# Patient Record
Sex: Female | Born: 1950 | Race: White | Hispanic: No | Smoking: Current every day smoker
Health system: Southern US, Community
[De-identification: ages and names within clinical notes are randomized; demographics above are authoritative.]

## PROBLEM LIST (undated history)

## (undated) DIAGNOSIS — C642 Malignant neoplasm of left kidney, except renal pelvis: Secondary | ICD-10-CM

## (undated) DIAGNOSIS — Z1231 Encounter for screening mammogram for malignant neoplasm of breast: Secondary | ICD-10-CM

## (undated) DIAGNOSIS — R928 Other abnormal and inconclusive findings on diagnostic imaging of breast: Secondary | ICD-10-CM

## (undated) DIAGNOSIS — M25522 Pain in left elbow: Secondary | ICD-10-CM

## (undated) DIAGNOSIS — M25532 Pain in left wrist: Secondary | ICD-10-CM

## (undated) DIAGNOSIS — N189 Chronic kidney disease, unspecified: Secondary | ICD-10-CM

## (undated) DIAGNOSIS — M1990 Unspecified osteoarthritis, unspecified site: Secondary | ICD-10-CM

## (undated) DIAGNOSIS — F419 Anxiety disorder, unspecified: Secondary | ICD-10-CM

## (undated) DIAGNOSIS — C4491 Basal cell carcinoma of skin, unspecified: Secondary | ICD-10-CM

## (undated) DIAGNOSIS — G9050 Complex regional pain syndrome I, unspecified: Secondary | ICD-10-CM

## (undated) HISTORY — PX: PARTIAL NEPHRECTOMY: SHX414

## (undated) HISTORY — PX: EYE SURGERY: SHX253

## (undated) HISTORY — PX: OTHER SURGICAL HISTORY: SHX169

## (undated) HISTORY — PX: KNEE SURGERY: SHX244

## (undated) HISTORY — PX: DEBRIDEMENT TENNIS ELBOW: SHX1442

---
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
## 1991-01-05 DIAGNOSIS — C4491 Basal cell carcinoma of skin, unspecified: Secondary | ICD-10-CM

## 1991-01-05 HISTORY — DX: Basal cell carcinoma of skin, unspecified: C44.91

## 1997-06-09 ENCOUNTER — Ambulatory Visit (HOSPITAL_BASED_OUTPATIENT_CLINIC_OR_DEPARTMENT_OTHER): Admission: RE | Admit: 1997-06-09 | Discharge: 1997-06-09 | Payer: Self-pay | Admitting: Orthopedic Surgery

## 1997-06-16 ENCOUNTER — Other Ambulatory Visit: Admission: RE | Admit: 1997-06-16 | Discharge: 1997-06-16 | Payer: Self-pay | Admitting: Orthopedic Surgery

## 1997-06-16 ENCOUNTER — Inpatient Hospital Stay (HOSPITAL_COMMUNITY): Admission: RE | Admit: 1997-06-16 | Discharge: 1997-06-23 | Payer: Self-pay | Admitting: Orthopedic Surgery

## 1997-06-28 ENCOUNTER — Other Ambulatory Visit: Admission: RE | Admit: 1997-06-28 | Discharge: 1997-06-28 | Payer: Self-pay | Admitting: Orthopedic Surgery

## 1997-07-04 ENCOUNTER — Ambulatory Visit (HOSPITAL_COMMUNITY): Admission: RE | Admit: 1997-07-04 | Discharge: 1997-07-04 | Payer: Self-pay | Admitting: Orthopedic Surgery

## 1997-07-05 ENCOUNTER — Other Ambulatory Visit: Admission: RE | Admit: 1997-07-05 | Discharge: 1997-07-05 | Payer: Self-pay | Admitting: Orthopedic Surgery

## 1999-03-09 ENCOUNTER — Inpatient Hospital Stay (HOSPITAL_COMMUNITY): Admission: RE | Admit: 1999-03-09 | Discharge: 1999-03-13 | Payer: Self-pay | Admitting: Orthopedic Surgery

## 1999-03-13 ENCOUNTER — Encounter: Payer: Self-pay | Admitting: Infectious Diseases

## 2002-07-01 ENCOUNTER — Ambulatory Visit (HOSPITAL_COMMUNITY): Admission: RE | Admit: 2002-07-01 | Discharge: 2002-07-01 | Payer: Self-pay | Admitting: Gastroenterology

## 2002-12-01 ENCOUNTER — Other Ambulatory Visit: Admission: RE | Admit: 2002-12-01 | Discharge: 2002-12-01 | Payer: Self-pay | Admitting: Obstetrics and Gynecology

## 2003-01-19 ENCOUNTER — Inpatient Hospital Stay (HOSPITAL_COMMUNITY): Admission: RE | Admit: 2003-01-19 | Discharge: 2003-01-24 | Payer: Self-pay | Admitting: Orthopedic Surgery

## 2003-02-04 ENCOUNTER — Emergency Department (HOSPITAL_COMMUNITY): Admission: EM | Admit: 2003-02-04 | Discharge: 2003-02-04 | Payer: Self-pay | Admitting: Emergency Medicine

## 2004-05-21 ENCOUNTER — Other Ambulatory Visit: Admission: RE | Admit: 2004-05-21 | Discharge: 2004-05-21 | Payer: Self-pay | Admitting: Obstetrics and Gynecology

## 2004-08-07 ENCOUNTER — Encounter: Admission: RE | Admit: 2004-08-07 | Discharge: 2004-08-07 | Payer: Self-pay | Admitting: Orthopedic Surgery

## 2005-01-29 ENCOUNTER — Encounter: Admission: RE | Admit: 2005-01-29 | Discharge: 2005-01-29 | Payer: Self-pay | Admitting: Family Medicine

## 2005-03-04 ENCOUNTER — Encounter: Admission: RE | Admit: 2005-03-04 | Discharge: 2005-06-02 | Payer: Self-pay | Admitting: Neurology

## 2005-07-11 ENCOUNTER — Encounter: Admission: RE | Admit: 2005-07-11 | Discharge: 2005-07-11 | Payer: Self-pay | Admitting: Orthopedic Surgery

## 2006-06-06 ENCOUNTER — Other Ambulatory Visit: Admission: RE | Admit: 2006-06-06 | Discharge: 2006-06-06 | Payer: Self-pay | Admitting: Family Medicine

## 2006-07-17 ENCOUNTER — Encounter: Admission: RE | Admit: 2006-07-17 | Discharge: 2006-07-17 | Payer: Self-pay | Admitting: Orthopaedic Surgery

## 2007-01-06 ENCOUNTER — Ambulatory Visit: Payer: Self-pay | Admitting: Sports Medicine

## 2007-01-06 DIAGNOSIS — M25569 Pain in unspecified knee: Secondary | ICD-10-CM

## 2007-01-06 DIAGNOSIS — G90529 Complex regional pain syndrome I of unspecified lower limb: Secondary | ICD-10-CM | POA: Insufficient documentation

## 2007-01-08 ENCOUNTER — Encounter: Payer: Self-pay | Admitting: Sports Medicine

## 2007-06-02 ENCOUNTER — Ambulatory Visit (HOSPITAL_COMMUNITY): Admission: RE | Admit: 2007-06-02 | Discharge: 2007-06-03 | Payer: Self-pay | Admitting: Neurosurgery

## 2008-05-24 ENCOUNTER — Ambulatory Visit: Payer: Self-pay | Admitting: Cardiology

## 2008-05-24 ENCOUNTER — Observation Stay (HOSPITAL_COMMUNITY): Admission: EM | Admit: 2008-05-24 | Discharge: 2008-05-25 | Payer: Self-pay | Admitting: Emergency Medicine

## 2008-05-26 ENCOUNTER — Telehealth (INDEPENDENT_AMBULATORY_CARE_PROVIDER_SITE_OTHER): Payer: Self-pay | Admitting: *Deleted

## 2008-06-01 ENCOUNTER — Telehealth (INDEPENDENT_AMBULATORY_CARE_PROVIDER_SITE_OTHER): Payer: Self-pay

## 2008-06-02 ENCOUNTER — Ambulatory Visit: Payer: Self-pay

## 2008-06-02 ENCOUNTER — Encounter: Payer: Self-pay | Admitting: Cardiology

## 2008-06-08 ENCOUNTER — Telehealth: Payer: Self-pay | Admitting: Cardiology

## 2008-06-14 DIAGNOSIS — M1990 Unspecified osteoarthritis, unspecified site: Secondary | ICD-10-CM | POA: Insufficient documentation

## 2008-06-14 DIAGNOSIS — F329 Major depressive disorder, single episode, unspecified: Secondary | ICD-10-CM

## 2008-06-14 DIAGNOSIS — I498 Other specified cardiac arrhythmias: Secondary | ICD-10-CM

## 2008-06-14 DIAGNOSIS — R079 Chest pain, unspecified: Secondary | ICD-10-CM

## 2008-07-26 ENCOUNTER — Other Ambulatory Visit: Admission: RE | Admit: 2008-07-26 | Discharge: 2008-07-26 | Payer: Self-pay | Admitting: Family Medicine

## 2008-12-26 ENCOUNTER — Encounter: Admission: RE | Admit: 2008-12-26 | Discharge: 2008-12-26 | Payer: Self-pay | Admitting: Chiropractic Medicine

## 2009-04-17 ENCOUNTER — Telehealth (INDEPENDENT_AMBULATORY_CARE_PROVIDER_SITE_OTHER): Payer: Self-pay | Admitting: *Deleted

## 2009-04-20 ENCOUNTER — Inpatient Hospital Stay (HOSPITAL_COMMUNITY): Admission: AD | Admit: 2009-04-20 | Discharge: 2009-04-23 | Payer: Self-pay | Admitting: Orthopedic Surgery

## 2009-04-20 ENCOUNTER — Encounter (INDEPENDENT_AMBULATORY_CARE_PROVIDER_SITE_OTHER): Payer: Self-pay | Admitting: Orthopedic Surgery

## 2010-03-11 ENCOUNTER — Encounter: Payer: Self-pay | Admitting: Orthopedic Surgery

## 2010-03-20 NOTE — Progress Notes (Signed)
  Phone Note From Other Clinic   Caller: [facility] Pre-Surgical Details for Reason: PT.Information Initial call taken by: KM    FAxed Stress over to [facility] @ [phone] [facility]  April 17, 2009 10:27 AM

## 2010-05-14 LAB — COMPREHENSIVE METABOLIC PANEL
Albumin: 4.1 g/dL (ref 3.5–5.2)
CO2: 31 mEq/L (ref 19–32)
Calcium: 9.7 mg/dL (ref 8.4–10.5)
GFR calc Af Amer: >60 mL/min (ref >60)
Sodium: 141 mEq/L (ref 135–145)

## 2010-05-14 LAB — CBC
HCT: 28.2 % — ABNORMAL LOW (ref 36.0–46.0)
Hemoglobin: 10.1 g/dL — ABNORMAL LOW (ref 12.0–15.0)
Hemoglobin: 10.6 g/dL — ABNORMAL LOW (ref 12.0–15.0)
Hemoglobin: 15.3 g/dL — ABNORMAL HIGH (ref 12.0–15.0)
Hemoglobin: 9.5 g/dL — ABNORMAL LOW (ref 12.0–15.0)
MCHC: 33.5 g/dL (ref 30.0–36.0)
MCHC: 33.7 g/dL (ref 30.0–36.0)
MCV: 93.9 fL (ref 78.0–100.0)
MCV: 95 fL (ref 78.0–100.0)
MCV: 95.4 fL (ref 78.0–100.0)
Platelets: 228 10*3/uL (ref 150–400)
Platelets: 253 10*3/uL (ref 150–400)
Platelets: 297 10*3/uL (ref 150–400)
RBC: 3.15 MIL/uL — ABNORMAL LOW (ref 3.87–5.11)
RBC: 3.34 MIL/uL — ABNORMAL LOW (ref 3.87–5.11)
RDW: 12.9 % (ref 11.5–15.5)
RDW: 13.4 % (ref 11.5–15.5)
WBC: 10.8 10*3/uL — ABNORMAL HIGH (ref 4.0–10.5)
WBC: 11.1 10*3/uL — ABNORMAL HIGH (ref 4.0–10.5)
WBC: 8.8 10*3/uL (ref 4.0–10.5)

## 2010-05-14 LAB — TISSUE CULTURE: Gram Stain: NONE SEEN

## 2010-05-14 LAB — ANAEROBIC CULTURE

## 2010-05-14 LAB — BASIC METABOLIC PANEL
BUN: 4 mg/dL — ABNORMAL LOW (ref 6–23)
CO2: 27 mEq/L (ref 19–32)
Calcium: 8.9 mg/dL (ref 8.4–10.5)
Calcium: 9 mg/dL (ref 8.4–10.5)
Calcium: 9 mg/dL (ref 8.4–10.5)
Creatinine, Ser: 0.73 mg/dL (ref 0.4–1.2)
Creatinine, Ser: 0.74 mg/dL (ref 0.4–1.2)
GFR calc Af Amer: >60 mL/min (ref >60)
GFR calc non Af Amer: >60 mL/min (ref >60)
Glucose, Bld: 153 mg/dL — ABNORMAL HIGH (ref 70–99)
Glucose, Bld: 164 mg/dL — ABNORMAL HIGH (ref 70–99)
Potassium: 4.4 mEq/L (ref 3.5–5.1)
Potassium: 5 mEq/L (ref 3.5–5.1)
Sodium: 139 mEq/L (ref 135–145)

## 2010-05-14 LAB — PROTIME-INR
INR: 1.14 (ref 0.00–1.49)
INR: 1.81 — ABNORMAL HIGH (ref 0.00–1.49)
Prothrombin Time: 17.4 seconds — ABNORMAL HIGH (ref 11.6–15.2)

## 2010-05-14 LAB — URINALYSIS, ROUTINE W REFLEX MICROSCOPIC
Bilirubin Urine: NEGATIVE
Nitrite: NEGATIVE
Protein, ur: NEGATIVE mg/dL
pH: 6 (ref 5.0–8.0)

## 2010-05-14 LAB — TYPE AND SCREEN

## 2010-05-14 LAB — APTT: aPTT: 29 seconds (ref 24–37)

## 2010-05-14 LAB — ABO/RH: ABO/RH(D): O POS

## 2010-05-14 LAB — GRAM STAIN

## 2010-05-30 LAB — POCT CARDIAC MARKERS
CKMB, poc: <1 ng/mL — ABNORMAL LOW (ref 1.0–8.0)
Myoglobin, poc: 38.7 ng/mL (ref 12–200)
Troponin i, poc: <0.05 ng/mL (ref 0.00–0.09)

## 2010-05-30 LAB — COMPREHENSIVE METABOLIC PANEL
AST: 23 U/L (ref 0–37)
Albumin: 3.6 g/dL (ref 3.5–5.2)
Alkaline Phosphatase: 41 U/L (ref 39–117)
BUN: 13 mg/dL (ref 6–23)
Chloride: 100 mEq/L (ref 96–112)
GFR calc Af Amer: >60 mL/min (ref >60)
Potassium: 3.5 mEq/L (ref 3.5–5.1)
Sodium: 133 mEq/L — ABNORMAL LOW (ref 135–145)
Total Bilirubin: 1.5 mg/dL — ABNORMAL HIGH (ref 0.3–1.2)
Total Protein: 6.2 g/dL (ref 6.0–8.3)

## 2010-05-30 LAB — BASIC METABOLIC PANEL
Calcium: 9.2 mg/dL (ref 8.4–10.5)
Creatinine, Ser: 0.75 mg/dL (ref 0.4–1.2)
GFR calc non Af Amer: >60 mL/min (ref >60)
Glucose, Bld: 95 mg/dL (ref 70–99)
Sodium: 136 mEq/L (ref 135–145)

## 2010-05-30 LAB — DIFFERENTIAL
Basophils Absolute: 0 10*3/uL (ref 0.0–0.1)
Basophils Relative: 1 % (ref 0–1)
Lymphocytes Relative: 34 % (ref 12–46)
Monocytes Absolute: 0.7 10*3/uL (ref 0.1–1.0)
Neutro Abs: 5 10*3/uL (ref 1.7–7.7)
Neutrophils Relative %: 56 % (ref 43–77)

## 2010-05-30 LAB — URINALYSIS, ROUTINE W REFLEX MICROSCOPIC
Bilirubin Urine: NEGATIVE
Nitrite: NEGATIVE
Protein, ur: NEGATIVE mg/dL
Specific Gravity, Urine: 1.005 (ref 1.005–1.030)
Urobilinogen, UA: 0.2 mg/dL (ref 0.0–1.0)

## 2010-05-30 LAB — CK TOTAL AND CKMB (NOT AT ARMC)
CK, MB: 1.1 ng/mL (ref 0.3–4.0)
Total CK: 30 U/L (ref 7–177)

## 2010-05-30 LAB — CARDIAC PANEL(CRET KIN+CKTOT+MB+TROPI)
CK, MB: 1 ng/mL (ref 0.3–4.0)
Total CK: 37 U/L (ref 7–177)
Troponin I: <0.01 ng/mL (ref 0.00–0.06)

## 2010-05-30 LAB — CBC
Hemoglobin: 13.7 g/dL (ref 12.0–15.0)
MCHC: 33.5 g/dL (ref 30.0–36.0)
Platelets: 249 10*3/uL (ref 150–400)
RDW: 13.5 % (ref 11.5–15.5)

## 2010-05-30 LAB — TROPONIN I: Troponin I: <0.01 ng/mL (ref 0.00–0.06)

## 2010-06-14 ENCOUNTER — Other Ambulatory Visit: Payer: Self-pay | Admitting: Family Medicine

## 2010-06-14 ENCOUNTER — Other Ambulatory Visit (HOSPITAL_COMMUNITY)
Admission: RE | Admit: 2010-06-14 | Discharge: 2010-06-14 | Disposition: A | Discharge status: Home / Self Care | Payer: Commercial Indemnity | Source: Ambulatory Visit | Attending: Family Medicine | Admitting: Family Medicine

## 2010-06-14 DIAGNOSIS — R87810 Cervical high risk human papillomavirus (HPV) DNA test positive: Secondary | ICD-10-CM | POA: Insufficient documentation

## 2010-06-14 DIAGNOSIS — Z124 Encounter for screening for malignant neoplasm of cervix: Secondary | ICD-10-CM | POA: Insufficient documentation

## 2010-07-03 NOTE — Op Note (Signed)
NAMESHATERICA, MCCLATCHY NO.:  [id number]   MEDICAL RECORD NO.:  [id number]          PATIENT TYPE:  OIB   LOCATION:  3599                         FACILITY:  [facility]   PHYSICIAN:  Reinaldo Meeker, M.D. DATE OF BIRTH:  1950-07-30   DATE OF PROCEDURE:  06/02/2007  DATE OF DISCHARGE:                               OPERATIVE REPORT   PREOPERATIVE DIAGNOSIS:  Chronic right knee pain, status post multiple  right knee procedures.   POSTOPERATIVE DIAGNOSIS:  Chronic right knee pain, status post multiple  right knee procedures.   PROCEDURE:  Permanent spinal stimulator implant with battery  implantation.   SURGEON:  Reinaldo Meeker, MD   PROCEDURE IN DETAIL:  After placing in the prone position, the patient's  mid back, lower back, and left buttock were prepped and draped in the  usual sterile fashion.  Localizing fluoroscopy was used to identify the  appropriate level.  Midline incision was made above the spinous process  of T12.  A subperiosteal dissection was then carried out on the left-  sided spinous processes and lamina.  Self-retaining retractor was placed  for exposure.  The left hemilaminotomy was performed on the left T12 and  trial passing of the lead showed some difficulty passing proximally the  T10 level.  This was dissected free with the passing device and a  Penfield 3 until we could pass into good position up to the level of  T10, and more towards the right side than the left.  At this time, a  small incision was made in the left buttock, so that a battery pouch  could be formed.  This turned out difficult and large amounts of  irrigation were carried out.  Passer was then passed from the buttock to  the back incision, and the lead passed down through the passer without  difficulty.  The lead was then placed into position and found to be in  good position once again.  Large amounts of irrigation were covered out  for both incisions at this time.  The  leads were attached to the battery  and the battery implanted.  Impedance testing returned normal.  Both  wounds were then irrigated once more with antibiotic irrigation.  Final  fluoroscopy showed the lead to remain in good position.  The wounds were  then closed with multiple layers of Vicryl in the muscle fascia,  subcutaneous and subcuticular tissues, staples were placed on the skin  in the thoracic region, and Dermabond was used on the skin in the  buttock.  Sterile dressings were then applied.  The patient extubated  and taken to the recovery room in stable condition.           ______________________________  Reinaldo Meeker, M.D.     ROK/MEDQ  D:  06/02/2007  T:  06/03/2007  Job:  [id number]

## 2010-07-03 NOTE — H&P (Signed)
NAMETHEDA, PAYER NO.:  [id number]   MEDICAL RECORD NO.:  [id number]          PATIENT TYPE:  EMS   LOCATION:  MAJO                         FACILITY:  [facility]   PHYSICIAN:  Madolyn Frieze. Jens Som, MD, FACCDATE OF BIRTH:  [birth year]   DATE OF ADMISSION:  05/24/2008  DATE OF DISCHARGE:                              HISTORY & PHYSICAL   Tonya Cain is a very pleasant 60 year old female with past medical  history of chronic pain in her right knee due to prior infection and I  am asked to evaluate for chest pain.  She has no prior cardiac history.  She does exercise routinely.  She typically does not have dyspnea on  exertion, orthopnea, PND, pedal edema, palpitations, presyncope,  syncope, or exertional chest pain.  This morning at approximately 1:00  a.m., she awoke with substernal chest pain.  It was described as a golf  ball in her chest.  The pain radiated to her left upper extremity.  There was no associated nausea, vomiting, shortness of breath, or  diaphoresis.  She did have a mild bile taste in her mouth.  It lasted  for approximately 30 minutes and resolved spontaneously.  It was not  pleuritic or positional.  She went back to sleep after taking 2 aspirin.  She then had a second episode at lunch today and this one lasted for 15-  20 minutes and similar to one described previously.  She has had no pain  since then.  She presented to an Urgent Care and then was referred to  the emergency room for further evaluation.  She is presently painfree.   HOME MEDICATIONS:  1. Celebrex of unknown dose.  2. Trazodone of unknown dose.  3. Hydrocodone 1-2 p.o. daily p.r.n.   ALLERGIES:  She has no known drug allergies.   SOCIAL HISTORY:  She does not smoke.  She is married.  She consumes 1-2  alcoholic beverages per day.   FAMILY HISTORY:  Significant for myocardial infarction in her father in  his mid 18s.   PAST MEDICAL HISTORY:  There is no diabetes mellitus,  hypertension,  hyperlipidemia.  She did have a knee infection in the right knee.  This  required arthroscopic surgery, partial knee replacement followed by full  knee replacement.  She has chronic pain due to this infection and has a  spinal implant due to that.  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  She has also had arthroscopic shoulder surgery on the  left.   REVIEW OF SYSTEMS:  She denies any headaches, fevers, or chills.  There  is no productive cough or hemoptysis.  There is no dysphagia,  odynophagia, melena, or hematochezia.  There is no dysuria or hematuria.  There is no rash or seizure activity.  There is no orthopnea, PND, or  pedal edema.  She does have chronic pain in the right knee.  The  remaining systems are negative.   PHYSICAL EXAMINATION:  VITAL SIGNS:  Blood pressure of 97/60.  Her pulse  is in the 40s.  GENERAL:  She is well developed  and well nourished, in no acute  distress.  She does not appear to be depressed.  SKIN:  Warm and dry.  BACK:  Normal other than the prior implant.  HEENT:  Normal with normal eyelids.  NECK:  Supple with normal upstrokes bilaterally.  No bruits.  There is  no jugular venous distention.  I cannot appreciate thyromegaly.  CHEST:  Clear to auscultation.  No expansion.  CARDIOVASCULAR:  A bradycardic rate, but a regular rhythm.  There are no  murmurs, rubs, or gallops noted.  There is no change with Valsalva.  ABDOMEN:  Nontender and nondistended.  Positive bowel sounds.  No  hepatosplenomegaly.  No mass appreciated.  There is no abdominal bruit.  She has 2+ femoral pulses bilaterally.  No bruits.  EXTREMITIES:  There is no peripheral clubbing.  No edema that I can  palpate.  No cords.  She has 2+ dorsalis pedis pulses bilaterally.  NEUROLOGIC:  Grossly intact.   Her electrocardiogram shows a marked sinus bradycardia with her heart  rate at 48.  There are no ST changes noted.  Her potassium is 4.1.  Her   initial cardiac markers are negative.  Her hemoglobin/hematocrit are  13.7 and 40.8 respectively.  Her chest x-ray shows no active  cardiopulmonary disease.   DIAGNOSES:  1. Chest pain - her symptoms are somewhat atypical, but she does have      a strong family history.  We will plan to admit to telemetry.  We      will rule out myocardial infarction and serial enzymes and we will      plan to repeat her electrocardiogram in the morning.  If it is      negative, then we will plan an outpatient Myoview for risk      stratification.  Note, she has traveled recently to [place] and also to [place].  We will check a D-dimer to screen for      pulmonary embolus.  Finally, she did describe a mild bile taste in      her mouth.  I will add Nexium.  She will follow up with me 2 weeks      after her Myoview.  2. Bradycardia - the patient has a heart rate in the 40s.  However,      she is not having symptoms with this.  We will follow this.  3. History of chronic right knee pain - management per primary care.      She will continue on Celebrex, trazodone, and hydrocodone.      Madolyn Frieze Jens Som, MD, [facility]  Electronically Signed     BSC/MEDQ  D:  05/24/2008  T:  05/25/2008  Job:  [id number]

## 2010-07-03 NOTE — Discharge Summary (Signed)
Tonya Cain, Tonya Cain NO.:  [id number]   MEDICAL RECORD NO.:  [id number]          PATIENT TYPE:  INP   LOCATION:  3701                         FACILITY:  [facility]   PHYSICIAN:  Tonya Abed, MD, FACCDATE OF BIRTH:  1950-09-25   DATE OF ADMISSION:  05/24/2008  DATE OF DISCHARGE:  05/25/2008                               DISCHARGE SUMMARY   PRIMARY CARDIOLOGIST:  Tonya Cain. Tonya Som, MD, [facility].   DISCHARGE DIAGNOSIS:  Chest pain.   SECONDARY DIAGNOSIS:  Osteoarthritis, status post right knee  replacement, complicated by infection with chronic right knee pain.   ALLERGIES:  No known drug allergies.   PROCEDURES:  None.   HISTORY OF PRESENT ILLNESS:  A 60 year old female without prior cardiac  history.  She was in the usual state of health until approximately 1  a.m. on May 24, 2008, when she developed substernal chest discomfort  with radiation to left axilla associated with a sour taste in her mouth.  This lasted about 30 minutes and resolved spontaneously.  She had a  second episode of discomfort that occurred during lunch on May 24, 2008, prompting her to present to the [facility] ED.  In the ED, ECG  showed sinus bradycardia with rate of 43, although no acute ST or T  changes.  Initial set of cardiac markers were negative.  The patient was  admitted for further evaluation.   HOSPITAL COURSE:  [name] ruled out for MI by cardiac markers.  Her  D-dimer was also normal at 0.26.  She has had no recurrent symptoms and  has been initiated on PPI therapy.  We will arrange for outpatient  Myoview and additional Cardiology followup.  She is being discharged  home today in good condition.   DISCHARGE LABS:  Hemoglobin 13.7, hematocrit 40.8, WBC 9.1, platelets  249, D-dimer 0.26.  Sodium 133, potassium 3.5, chloride 100, CO2 26, BUN  13, creatinine 0.77, glucose 120, total bilirubin 1.5, alkaline  phosphatase 41, AST 23, ALT 16, total protein 6.2, albumin 3.6,  calcium  8.8, CK 37, MB 1.02, troponin I less than 0.01.  Urinalysis was  negative.   DISPOSITION:  The patient will be discharged home today in good  condition.   FOLLOWUP PLANS AND APPOINTMENTS:  We will arrange for followup,  adenosine Myoview in our office later this week.  She will then follow  up with Dr. Jens Cain in approximately 2 weeks.  We have asked her to  follow up with primary care Jassen Sarver as previously scheduled.   DISCHARGE MEDICATIONS:  1. Aspirin 81 mg daily.  2. Prilosec OTC 20 mg daily.  3. Celebrex 200 mg b.i.d.  4. Trazodone 150 mg nightly.  5. Hydrocodone/acetaminophen 5/500 mg q.4 h. p.r.n.   OUTSTANDING LAB STUDIES:  Adenosine Myoview is pending.   DURATION OF DISCHARGE ENCOUNTER:  35 minutes including physician time.      Nicolasa Ducking, ANP      Tonya Abed, MD, [facility]  Electronically Signed    CB/MEDQ  D:  05/25/2008  T:  05/25/2008  Job:  [id number]

## 2010-07-06 NOTE — Op Note (Signed)
NAMEMERCIA, DOWE NO.:  [id number]   MEDICAL RECORD NO.:  [id number]                   PATIENT TYPE:  INP   LOCATION:  X010                                 FACILITY:  [facility]   PHYSICIAN:  Ollen Gross, M.D.                 DATE OF BIRTH:  09/10/1950   DATE OF PROCEDURE:  01/19/2003  DATE OF DISCHARGE:                                 OPERATIVE REPORT   PREOPERATIVE DIAGNOSIS:  Failed right unicompartmental knee replacement.   POSTOPERATIVE DIAGNOSIS:  Failed right unicompartmental knee replacement.   PROCEDURE:  Conversion right unicompartmental knee replacement for total  knee arthroplasty.   SURGEON:  Ollen Gross, M.D.   ASSISTANT:  Alexzandrew L. Julien Girt, P.A.   ANESTHESIA:  General.   ESTIMATED BLOOD LOSS:  Minimal.   DRAINS:  Hemovac x1.   COMPLICATIONS:  None.   TOURNIQUET TIME:  60 minutes at 300 mmHg.   CONDITION:  Stable to recovery.   BRIEF CLINICAL NOTE:  [name] is a 60 year old female whose had a long  complicated history in regards to her right knee. Approximately two years  ago, she underwent a unicompartmental knee replacement which did very well  initially but she did have progressively worsening right knee pain. 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 She presents now for conversion of the unicompartmental  replacement to a total knee arthroplasty.   DESCRIPTION OF PROCEDURE:  At successful administration of general  anesthetic, a tourniquet was placed high on the right thigh, right lower  extremity prepped and draped in the usual sterile fashion. Extremity was  wrapped in Esmarch, knee flexed, tourniquet inflated to 300 mmHg. She had a  previous medial parapatellar incision so I utilized that in the skin cuts  with a 10 blade through the subcutaneous tissue to the level of the extensor  mechanism. A fresh blade was used to make a medial parapatellar  arthrotomy.  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 I excised that. I then subperiosteally elevated the tissue  medially through the joint line with a knife and into the semimembranosus  bursa with a Cobb elevator. The scar in the infrapatellar fat pad then  excised and the patella was easily everted. The knee flexed 90 degrees. ACL  and PCL removed. She had severe osteophyte formation throughout the entire  joint. I was then able to remove the femoral component of the  unicompartmental replacement. Bone had completely overgrown the medial most  aspect of this. After I removed the bone the component came out easily. We  then used a drill to create a starting hole in the distal femur and  irrigated the canal. The 5 degree right valgus alignment guide was placed.  She had some bone loss both medial and lateral. I thus took 7 mm off the  distal femur as that looked like it would be the most appropriate cut to get  to the base of the trochlea. The distal femoral resection is made with an  oscillating saw. A sizing block is placed and a size 3 is the most  appropriate. We did rotation at the epicondylar axis. The AP cutting block  is placed and anterior and posterior cuts made for a size 3.   The tibia is subluxed forward and menisci removed. The extramedullary tibial  alignment guide is placed referencing proximally at the medial aspect of the  tibial tubercle and distally along the second metatarsal axis at the tibial  crest. The block was pinned to remove 10 mm off the lateral side. After  doing a tibial resection by taking an additional 2 mm, I would have been  able to get to the bottom of the component medially. Instead of cutting low  and putting a wedge I decided to take the resection down to 12 mm and do the  resection with an oscillating saw. This lead to a nice flat bone surface and  got down to the base of the medial defect. A size 3 was  the most appropriate  tibial component and I prepared the proximal tibia with the modular drill  and keel punch. The femoral preparation was then completed with unicondylar  and chamfer cuts and a size 3.   A size 3 posterior stabilized femoral component, size 3 mobile bearing  tibial trial and a 12.5 mm posterior stabilized rotating platform insert  placed. With a 12.5 she had quite a bit of laxity both flexion and extension  so I went to a 15 which allowed for full extension with excellent varus and  valgus balance throughout her full range of motion. The patella was then  everted, thickness measured to be 23 mm, free hand resection taken to 13 mm,  38 mm templates placed, lug holes are drilled, 38 trial is placed, and it  tracks normally. We then removed the osteophytes off the posterior femur  with the trials in place. All trials are removed and the cut bone surfaces  prepared with pulsatile lavage. Cement is mixed and once ready for  implantation the permanent size 3 mobile bearing tibial tray, size 3  posterior stabilized femur and 38 patella are cemented into place and  patella is held with a clamp. A trial 15 mm insert is placed, knee held in  full extension, all extruded cement removed. Once the cement was fully  hardened then the permanent 15 mm posterior stabilized rotating platform  insert is placed into the tibial tray. The wound is copiously irrigated with  antibiotic solution and extensor mechanism closed over a Hemovac drain with  interrupted #1 PDS. Flexion against gravity is 135 degrees. The subcu is  closed with interrupted 2-0 Vicryl after releasing the tourniquet for a  total time of 60 minutes. The subcuticular was closed with running 4-0  Monocryl. The incision was cleaned and dried and Steri-Strips and a bulky  sterile dressing applied. The catheter for the Marcaine pain pump is placed. She was subsequently awakened and transported to recovery in stable   condition.                                               Ollen Gross, M.D.    FA/MEDQ  D:  01/19/2003  T:  01/20/2003  Job:  [id number]

## 2010-07-06 NOTE — Discharge Summary (Signed)
NAMEGIAVONNI, Tonya Cain NO.:  [id number]   MEDICAL RECORD NO.:  [id number]                   PATIENT TYPE:  INP   LOCATION:  0472                                 FACILITY:  [facility]   PHYSICIAN:  Ollen Gross, M.D.                 DATE OF BIRTH:  [birth year]   DATE OF ADMISSION:  01/19/2003  DATE OF DISCHARGE:  01/24/2003                                 DISCHARGE SUMMARY   ADMITTING DIAGNOSES:  1. Osteoarthritis right knee, status post unicompartmental replacement.  2. Depression.   DISCHARGE DIAGNOSES:  1. Failed right unicompartmental knee replacement, status post conversion of     right unicompartmental over to total knee arthroplasty.  2. Osteoarthritis right knee, status post unicompartmental replacement.  3. Depression.   PROCEDURE:  On January 19, 2003, underwent a conversion of a  unicompartmental over to a right total knee arthroplasty.  Surgeon:  Dr.  Homero Fellers Aluisio.  Assistant:  Patrica Duel, P.A.C.  Anesthesia:  General..  Blood loss minimal.  Hemovac drain x1.  Tourniquet time 60  minutes at 300 mmHg.   CONSULTS:  None.   BRIEF HISTORY:  Tonya Cain is a 60 year old female, long complicated  history in regards to her right knee.  Apparently 2 years ago underwent a  unicompartmental knee replacement which did well initially, but she  developed worsening knee pain.  This was done at [facility].  X-rays  showed tricompartmental degeneration, and now she presents for conversion  from a unicompartmental to a total knee arthroplasty.   LABORATORY DATA:  CBC on admission revealed hemoglobin of 13.3, hematocrit  of 38.9, white cell count 7, red cell count 4.16.  Postoperative H&H 10.7  and 29.5.  Last noted H&H 8.7 and 27.  Differential on admission:  CBC  within normal limits.  PT/PTT preoperatively 13 and 30 respectively, with an  INR of 1.  Serial pro times were followed.  Last noted PT/INR 19.6 and  __________.  Chemistries on  admission all within normal limits.  Serial  BMETs were followed.  Sodium did drop from 137 to 133, back up to 138.  Calcium dropped from 9.9 to 8, back up to 8.8.  Glucose went up from 96 to  148, back down to 124.  Urinalysis preoperatively did show positive nitrite,  large leukocyte esterase, rare bacteria, and white cells too numerous to  count.  Follow-up UA on January 19, 2003, negative.  Blood group type O  positive.   EKG dated January 10, 2003:  Sinus bradycardia.  Possible anterior infarct,  age undetermined.  Abnormal EKG.  This was unconfirmed.   Chest x-ray preoperatively January 10, 2003:  Negative chest for active  disease.   HOSPITAL COURSE:  The patient was admitted to [facility], taken to  the OR, and underwent the above-stated procedure without complication.  The  patient tolerated the procedure well and was later  transferred to the  recovery room and then taken to the orthopedic floor for continued  postoperative care.  Vital signs were followed.  The patient was given 48  hours of postoperative antibiotics in the form of Ancef, Coumadin for 3  weeks, started back on her home medications.  Weightbearing as tolerated.  PT and OT were consulted to assist with gait-training ambulation and ADLs.  Hemovac drain placed at the time of surgery was pulled on postoperative day  #1.  She also had a _________, which was placed at the time of surgery, and  it was pulled on postoperative day #2.  Fluids were KVO'd by day #1.  By day  #2 she was doing better, and the PCA and IVs were discontinued.  Dressing  was changed.  Incision was healing well.  She started to progress with  physical therapy.  It was felt that she was doing fine, to the point where  she would be able to go home after her hospital stay.  She stayed through  the weekend, progressing with her physical therapy.  By day #4 her pain was  under better control.  By day #5 she was up, moving much more  independently,  tolerating her p.o. medications.  She was seen by Dr. Lequita Halt, and it was  decided she could be discharged home.   DISCHARGE PLAN:  1. The patient was discharged home on January 24, 2003.  2. Discharge diagnoses:  Please see above.  3. Discharge medications:  Percocet and Robaxin.  4. Follow-up:  In 2 weeks.  5. Activity:  Weightbearing as tolerated.  Followup PT, home health nursing.     May start showering.   DISPOSITION:  Home.   CONDITION UPON DISCHARGE:  Improved.     Alexzandrew L. Julien Girt, P.A.              Ollen Gross, M.D.    ALP/MEDQ  D:  03/08/2003  T:  03/09/2003  Job:  [id number]

## 2010-07-06 NOTE — Op Note (Signed)
   NAMEPHILOMINA, [name] NO.:  [id number]   MEDICAL RECORD NO.:  [id number]                   PATIENT TYPE:  AMB   LOCATION:  ENDO                                 FACILITY:  [facility]   PHYSICIAN:  Graylin Shiver, M.D.                DATE OF BIRTH:  09/13/1950   DATE OF PROCEDURE:  07/01/2002  DATE OF DISCHARGE:                                 OPERATIVE REPORT   PROCEDURE:  Colonoscopy.   INDICATIONS FOR PROCEDURE:  Screening for colon cancer and colon polyps.   INFORMED CONSENT:  Informed consent was obtained.   PREMEDICATIONS:  1. Fentanyl 75 mcg IV.  2. Versed 6. mg IV.   DESCRIPTION OF PROCEDURE:  With the patient in the left lateral decubitus  position, a rectal exam was performed, and no masses were felt.  The Olympus  colonoscope was inserted into the rectum and advanced around the colon to  the cecum.  The cecal landmarks were identified.  The cecum and ascending  colon were normal.  The transverse colon was normal.  The descending colon,  sigmoid, and rectum were normal.  The scope was retroflexed to the rectum.  No abnormalities were seen.  The scope was then straightened and brought  out.  She tolerated the procedure well without complications.   IMPRESSION:  Normal colonoscopy to the cecum.                                               Graylin Shiver, M.D.    SFG/MEDQ  D:  07/01/2002  T:  07/01/2002  Job:  [id number]   cc:   Caryn Bee L. Little, M.D.  [address]  [place]  [place] [address]  Fax: [phone]

## 2010-07-06 NOTE — H&P (Signed)
NAMEEMILLIE, CHASEN NO.:  [id number]   MEDICAL RECORD NO.:  [id number]                   PATIENT TYPE:  INP   LOCATION:  NA                                   FACILITY:  [facility]   PHYSICIAN:  Ollen Gross, M.D.                 DATE OF BIRTH:  Feb 14, 1951   DATE OF ADMISSION:  01/19/2003  DATE OF DISCHARGE:                                HISTORY & PHYSICAL   CHIEF COMPLAINT:  Continued pain in the right knee.   HISTORY OF PRESENT ILLNESS:  [name] is a 60 year old female with a long  complex history in regards to her right knee.  This dates back to  approximately four years when she had an arthroscopy secondary to medial  compartment mechanical symptoms and degenerative change.  Dr. Eulah Pont  originally did that surgery, and she unfortunately had a postoperative Staph  infection which required a debridement and long-term IV antibiotics with a  PICC line.  She then went on to develop RSV in the postoperative period, and  after progressive treatment did get her range of motion back.  She  eventually developed medial compartment arthritic change, and Dr. Duffy Bruce at [facility] did an unicompartmental replacement two years ago.  She  did very well initially, but then earlier this year she started to develop  pain and mechanical symptoms of her right knee.  She underwent arthroscopy  in June with loose body removal.  She was told at that point that the  arthritis had spread to the entire knee and given her pain, that she would  be a candidate for a total knee replacement.   She presented to Dr. Lequita Halt for a second opinion and consideration of  conversion to her right total knee arthroplasty.  Dr. Lequita Halt discussed  options with [name], and he feels it is best to proceed with a right  total knee arthroplasty at this point.  She agrees.  The risks and benefits  of the surgery have been discussed with the patient, the patient wishes to  proceed.   PAST MEDICAL HISTORY:  Depression.   PAST SURGICAL HISTORY:  1. Cesarean section.  2. Bilateral elbow surgery.  3. Right knee scope.  4. Right knee unicompartmental arthroplasty.   MEDICATIONS:  1. Bextra 20 mg one p.o. daily.  2. Wellbutrin XL 150 mg three p.o. q.a.m.  3. Trazodone 50 mg one p.o. at bedtime.  4. Vicodin 1-2 q.4-6 h. p.r.n. pain.   ALLERGIES:  No known drug allergies.   SOCIAL HISTORY:  The patient denies any tobacco use.  She has one to two  drinks per day.  She is married.  She lives in a two story house, three  steps entering the house.   FAMILY HISTORY:  Father deceased with myocardial infarction, also with  coronary artery disease.  Mother deceased with pancreatic cancer.   REVIEW OF SYSTEMS:  GENERAL:  Denies fevers, chills, night sweats, or  bleeding tendencies.  NEUROLOGIC:  Positive for blurry vision, denies double  vision, seizures, headaches, paralysis.  RESPIRATORY:  Denies shortness of  breath, productive cough, hemoptysis.  CARDIOVASCULAR:  Denies chest pain,  angina, orthopnea.  GASTROINTESTINAL:  Denies nausea, vomiting, diarrhea,  constipation, melena, or bloody stools.  GENITOURINARY:  Denies dysuria,  hematuria, discharge.  MUSCULOSKELETAL:  Pertinent to HPI.   PHYSICAL EXAMINATION:  VITAL SIGNS:  Blood pressure 120/70, pulse 60,  respirations 12.  GENERAL:  A well-developed, well-nourished, 60 year old female in no  apparent distress.  HEENT:  Normocephalic, atraumatic.  Pupils equal, round, reactive to light.  NECK:  No carotid bruit noted.  CHEST:  Clear to auscultation bilaterally.  No wheezes or crackles.  HEART:  Regular rate and rhythm, no murmurs, rubs, or gallops.  ABDOMEN:  Soft, nontender, nondistended, positive bowel sounds x4.  EXTREMITIES:  Right knee shows a well-healed scar from unicompartmental  replacement.  Range of motion from 0-130 degrees.  She is very tender  throughout the knee.  No effusion or instability.  She has  a slightly  antalgic gait pattern.   RADIOLOGICAL DATA:  Radiographs reviewed from [facility] reveal bone-  on-bone change in the lateral compartment in the patellofemoral compartment  of the right knee.   IMPRESSION:  1. Osteoarthritis of the right knee.  2. Depression.   PLAN:  The patient will be admitted to [facility] and undergo  conversion from a unicompartmental replacement to a right total knee  arthroplasty by Dr. Ollen Gross on January 19, 2003.     Clarene Reamer, P.A.-C.                   Ollen Gross, M.D.    SW/MEDQ  D:  01/11/2003  T:  01/11/2003  Job:  [id number]

## 2010-11-13 LAB — CBC
HCT: 41.3
Hemoglobin: 13.9
Platelets: 288
RDW: 12.9
WBC: 8.6

## 2011-10-22 ENCOUNTER — Other Ambulatory Visit (HOSPITAL_COMMUNITY)
Admission: RE | Admit: 2011-10-22 | Discharge: 2011-10-22 | Disposition: A | Discharge status: Home / Self Care | Payer: BC Managed Care – PPO | Source: Ambulatory Visit | Attending: Family Medicine | Admitting: Family Medicine

## 2011-10-22 ENCOUNTER — Other Ambulatory Visit: Payer: Self-pay | Admitting: Family Medicine

## 2011-10-22 DIAGNOSIS — Z124 Encounter for screening for malignant neoplasm of cervix: Secondary | ICD-10-CM | POA: Insufficient documentation

## 2011-10-22 DIAGNOSIS — Z1151 Encounter for screening for human papillomavirus (HPV): Secondary | ICD-10-CM | POA: Insufficient documentation

## 2012-11-13 ENCOUNTER — Other Ambulatory Visit (HOSPITAL_COMMUNITY)
Admission: RE | Admit: 2012-11-13 | Discharge: 2012-11-13 | Disposition: A | Discharge status: Home / Self Care | Payer: Medicare Other | Source: Ambulatory Visit | Attending: Family Medicine | Admitting: Family Medicine

## 2012-11-13 ENCOUNTER — Other Ambulatory Visit: Payer: Self-pay | Admitting: Family Medicine

## 2012-11-13 DIAGNOSIS — Z1151 Encounter for screening for human papillomavirus (HPV): Secondary | ICD-10-CM | POA: Insufficient documentation

## 2012-11-13 DIAGNOSIS — Z124 Encounter for screening for malignant neoplasm of cervix: Secondary | ICD-10-CM | POA: Insufficient documentation

## 2013-01-13 ENCOUNTER — Encounter (HOSPITAL_COMMUNITY): Payer: Self-pay | Admitting: Emergency Medicine

## 2013-01-13 ENCOUNTER — Observation Stay (HOSPITAL_COMMUNITY)
Admission: EM | Admit: 2013-01-13 | Discharge: 2013-01-14 | Disposition: A | Discharge status: Home / Self Care | Payer: Medicare Other | Attending: Internal Medicine | Admitting: Internal Medicine

## 2013-01-13 ENCOUNTER — Emergency Department (HOSPITAL_COMMUNITY): Payer: Medicare Other

## 2013-01-13 DIAGNOSIS — M1990 Unspecified osteoarthritis, unspecified site: Secondary | ICD-10-CM

## 2013-01-13 DIAGNOSIS — F329 Major depressive disorder, single episode, unspecified: Secondary | ICD-10-CM

## 2013-01-13 DIAGNOSIS — I498 Other specified cardiac arrhythmias: Secondary | ICD-10-CM

## 2013-01-13 DIAGNOSIS — Z8249 Family history of ischemic heart disease and other diseases of the circulatory system: Secondary | ICD-10-CM | POA: Insufficient documentation

## 2013-01-13 DIAGNOSIS — G9050 Complex regional pain syndrome I, unspecified: Secondary | ICD-10-CM | POA: Insufficient documentation

## 2013-01-13 DIAGNOSIS — G90529 Complex regional pain syndrome I of unspecified lower limb: Secondary | ICD-10-CM

## 2013-01-13 DIAGNOSIS — R079 Chest pain, unspecified: Principal | ICD-10-CM | POA: Diagnosis present

## 2013-01-13 DIAGNOSIS — M25569 Pain in unspecified knee: Secondary | ICD-10-CM

## 2013-01-13 HISTORY — DX: Complex regional pain syndrome I, unspecified: G90.50

## 2013-01-13 LAB — CBC WITH DIFFERENTIAL/PLATELET
Basophils Absolute: 0.1 10*3/uL (ref 0.0–0.1)
Basophils Relative: 1 % (ref 0–1)
HCT: 38.6 % (ref 36.0–46.0)
Hemoglobin: 12.9 g/dL (ref 12.0–15.0)
Lymphocytes Relative: 34 % (ref 12–46)
Lymphs Abs: 2.3 10*3/uL (ref 0.7–4.0)
MCV: 93 fL (ref 78.0–100.0)
Monocytes Absolute: 0.6 10*3/uL (ref 0.1–1.0)
Monocytes Relative: 9 % (ref 3–12)
Neutro Abs: 3.7 10*3/uL (ref 1.7–7.7)
Neutrophils Relative %: 54 % (ref 43–77)
RDW: 12.8 % (ref 11.5–15.5)
WBC: 6.8 10*3/uL (ref 4.0–10.5)

## 2013-01-13 LAB — CBC
HCT: 38.9 % (ref 36.0–46.0)
Hemoglobin: 13 g/dL (ref 12.0–15.0)
MCH: 31 pg (ref 26.0–34.0)
MCHC: 33.4 g/dL (ref 30.0–36.0)
MCV: 92.6 fL (ref 78.0–100.0)
Platelets: 274 10*3/uL (ref 150–400)
RBC: 4.2 MIL/uL (ref 3.87–5.11)
RDW: 12.7 % (ref 11.5–15.5)
WBC: 6 10*3/uL (ref 4.0–10.5)

## 2013-01-13 LAB — POCT I-STAT TROPONIN I: Troponin i, poc: 0 ng/mL (ref 0.00–0.08)

## 2013-01-13 LAB — MAGNESIUM: Magnesium: 2 mg/dL (ref 1.5–2.5)

## 2013-01-13 LAB — PROTIME-INR
INR: 1.07 (ref 0.00–1.49)
Prothrombin Time: 13.7 seconds (ref 11.6–15.2)

## 2013-01-13 LAB — PHOSPHORUS: Phosphorus: 3.5 mg/dL (ref 2.3–4.6)

## 2013-01-13 LAB — COMPREHENSIVE METABOLIC PANEL
AST: 18 U/L (ref 0–37)
Albumin: 3.4 g/dL — ABNORMAL LOW (ref 3.5–5.2)
BUN: 15 mg/dL (ref 6–23)
Calcium: 9.1 mg/dL (ref 8.4–10.5)
Creatinine, Ser: 0.93 mg/dL (ref 0.50–1.10)

## 2013-01-13 LAB — BASIC METABOLIC PANEL
BUN: 13 mg/dL (ref 6–23)
CO2: 27 mEq/L (ref 19–32)
Glucose, Bld: 96 mg/dL (ref 70–99)
Potassium: 3.8 mEq/L (ref 3.5–5.1)
Sodium: 136 mEq/L (ref 135–145)

## 2013-01-13 LAB — TSH: TSH: 0.513 u[IU]/mL (ref 0.350–4.500)

## 2013-01-13 LAB — TROPONIN I: Troponin I: <0.3 ng/mL (ref <0.30)

## 2013-01-13 MED ORDER — ACETAMINOPHEN 650 MG RE SUPP: 650.0000 mg | Freq: Four times a day (QID) | RECTAL | Status: DC | PRN

## 2013-01-13 MED ORDER — HYDROCODONE-ACETAMINOPHEN 5-325 MG PO TABS: 1.0000 | ORAL_TABLET | ORAL | Status: DC | PRN

## 2013-01-13 MED ORDER — SODIUM CHLORIDE 0.9 % IJ SOLN: 3.0000 mL | Freq: Two times a day (BID) | INTRAMUSCULAR | Status: DC

## 2013-01-13 MED ORDER — MORPHINE SULFATE 2 MG/ML IJ SOLN: 1.0000 mg | INTRAMUSCULAR | Status: DC | PRN

## 2013-01-13 MED ORDER — ONDANSETRON HCL 4 MG/2ML IJ SOLN: 4.0000 mg | Freq: Four times a day (QID) | INTRAMUSCULAR | Status: DC | PRN

## 2013-01-13 MED ORDER — CELECOXIB 200 MG PO CAPS
200.0000 mg | ORAL_CAPSULE | Freq: Two times a day (BID) | ORAL | Status: DC | PRN
  Filled 2013-01-13: qty 1

## 2013-01-13 MED ORDER — SODIUM CHLORIDE 0.9 % IV SOLN
INTRAVENOUS | Status: AC
  Administered 2013-01-13: 75 mL/h via INTRAVENOUS

## 2013-01-13 MED ORDER — BUPRENORPHINE 20 MCG/HR TD PTWK: 20.0000 ug | MEDICATED_PATCH | TRANSDERMAL | Status: DC

## 2013-01-13 MED ORDER — BUPRENORPHINE 10 MCG/HR TD PTWK: 20.0000 ug | MEDICATED_PATCH | TRANSDERMAL | Status: DC

## 2013-01-13 MED ORDER — DOCUSATE SODIUM 100 MG PO CAPS
100.0000 mg | ORAL_CAPSULE | Freq: Two times a day (BID) | ORAL | Status: DC
  Administered 2013-01-13 – 2013-01-14 (×2): 100 mg via ORAL
  Filled 2013-01-13 (×3): qty 1

## 2013-01-13 MED ORDER — ACETAMINOPHEN 325 MG PO TABS: 650.0000 mg | ORAL_TABLET | Freq: Four times a day (QID) | ORAL | Status: DC | PRN

## 2013-01-13 MED ORDER — TRAZODONE HCL 150 MG PO TABS
150.0000 mg | ORAL_TABLET | Freq: Every day | ORAL | Status: DC
  Administered 2013-01-13: 150 mg via ORAL
  Filled 2013-01-13 (×2): qty 1

## 2013-01-13 MED ORDER — ASPIRIN 81 MG PO CHEW
324.0000 mg | CHEWABLE_TABLET | Freq: Once | ORAL | Status: AC
  Administered 2013-01-13: 324 mg via ORAL
  Filled 2013-01-13: qty 4

## 2013-01-13 MED ORDER — ONDANSETRON HCL 4 MG PO TABS: 4.0000 mg | ORAL_TABLET | Freq: Four times a day (QID) | ORAL | Status: DC | PRN

## 2013-01-13 NOTE — H&P (Addendum)
[facility] History and Physical  Tonya Cain ZOX:[id number] DOB: November 16, 1950 DOA: 01/13/2013  Referring physician: ER physician PCP: No primary provider on file.   Chief Complaint: chest pain   HPI:  Pt is 62 yo female who presents to [facility] ED with main concern of sudden onset of midsternal area chest pain started 1 day prior to this admission, lasting 10 minutes and radiating to upper chest area and left neck area, 8/10 in severity and associated with diaphoresis. Patient reported pain resolved on its own. Pain came back this am and pt reported she was concerned something is wrong and she needed to get evaluated. Her both parents had angina and her father had died of heart attack. Pt denies similar events in the past, no fevers, chills, no shortness of breath, no abdominal or urinary concerns, no specific focal neurological symptoms. She explains she was in her usual state of heatlh, shopping in the store when symptoms suddenly occurred.   Assessment and Plan:  Active Problems:   Chest pain - rule out ACS - cardiac enzymes are negative so far - no acute findings on 12 lead EKG - follow up 2 D ECHO  Radiological Exams on Admission: Dg Chest 2 View 01/13/2013   IMPRESSION: No acute abnormalities.  No interval change.   Electronically Signed   By: Ulyses Southward M.D.   On: 01/13/2013 10:18    EKG: Normal sinus rhythm, no ST/T wave changes  Code Status: Full Family Communication: Pt at bedside Disposition Plan: Admit for further evaluation  Manson Passey, MD  [facility] Pager [phone]  Review of Systems:  Constitutional: Negative for fever, chills and malaise/fatigue. Negative for diaphoresis.  HENT: Negative for hearing loss, ear pain, nosebleeds, congestion, sore throat, neck pain, tinnitus and ear discharge.   Eyes: Negative for blurred vision, double vision, photophobia, pain, discharge and redness.  Respiratory: Negative for cough, hemoptysis, sputum  production, wheezing and stridor.   Cardiovascular: Negative for palpitations, orthopnea, claudication and leg swelling.  Gastrointestinal: Negative for nausea, vomiting and abdominal pain. Negative for heartburn, constipation, blood in stool and melena.  Genitourinary: Negative for dysuria, urgency, frequency, hematuria and flank pain.  Musculoskeletal: Negative for myalgias, back pain, joint pain and falls.  Skin: Negative for itching and rash.  Neurological: Negative for tingling, tremors, sensory change, speech change, focal weakness, loss of consciousness and headaches.  Endo/Heme/Allergies: Negative for environmental allergies and polydipsia. Does not bruise/bleed easily.  Psychiatric/Behavioral: Negative for suicidal ideas. The patient is not nervous/anxious.      Past Medical History  Diagnosis Date  . RSD (reflex sympathetic dystrophy)    Past Surgical History  Procedure Laterality Date  . Knee surgery      right knee 1999, 2002, 2005, 2009   Social History:  reports that she has never smoked. She has never used smokeless tobacco. She reports that she drinks about 0.6 ounces of alcohol per week. She reports that she does not use illicit drugs.  No Known Allergies  Family History: both parents had angina and father dies of heart attack  Prior to Admission medications   Medication Sig Start Date End Date Taking? Authorizing Provider  buprenorphine (BUTRANS) 20 MCG/HR PTWK patch Place 20 mcg onto the skin once a week.   Yes Historical Provider, MD  celecoxib (CELEBREX) 200 MG capsule Take 200 mg by mouth 2 (two) times daily as needed for mild pain or moderate pain.   Yes Historical Provider, MD  traZODone (DESYREL) 150 MG tablet  Take 150 mg by mouth at bedtime.   Yes Historical Provider, MD   Physical Exam: Filed Vitals:   01/13/13 0808 01/13/13 1216 01/13/13 1332  BP: 131/81 111/69 115/70  Pulse: 59 60 55  Temp: 97.9 F (36.6 C) 98.5 F (36.9 C) 98 F (36.7 C)   TempSrc: Oral Oral Oral  Resp: 16 16 19   Height: 5\' 4"  (1.626 m)  5\' 4"  (1.626 m)  Weight: 47.628 kg (105 lb)  48.399 kg (106 lb 11.2 oz)  SpO2: 96% 96% 97%    Physical Exam  Constitutional: Appears well-developed and well-nourished. No distress.  HENT: Normocephalic. External right and left ear normal. Oropharynx is clear and moist.  Eyes: Conjunctivae and EOM are normal. PERRLA, no scleral icterus.  Neck: Normal ROM. Neck supple. No JVD. No tracheal deviation. No thyromegaly.  CVS: RRR, S1/S2 +, no murmurs, no gallops, no carotid bruit.  Pulmonary: Effort and breath sounds normal, no stridor, rhonchi, wheezes, rales.  Abdominal: Soft. BS +,  no distension, tenderness, rebound or guarding.  Musculoskeletal: Normal range of motion. No edema and no tenderness.  Lymphadenopathy: No lymphadenopathy noted, cervical, inguinal. Neuro: Alert. Normal reflexes, muscle tone coordination. No cranial nerve deficit. Skin: Skin is warm and dry. No rash noted. Not diaphoretic. No erythema. No pallor.  Psychiatric: Normal mood and affect. Behavior, judgment, thought content normal.   Labs on Admission:  Basic Metabolic Panel:  Recent Labs Lab 01/13/13 0815 01/13/13 1420  NA 136 137  K 3.8 3.7  CL 100 102  CO2 27 27  GLUCOSE 96 111*  BUN 13 15  CREATININE 0.78 0.93  CALCIUM 9.3 9.1  MG  --  2.0  PHOS  --  3.5   Liver Function Tests:  Recent Labs Lab 01/13/13 1420  AST 18  ALT 11  ALKPHOS 51  BILITOT 0.6  PROT 6.3  ALBUMIN 3.4*   CBC:  Recent Labs Lab 01/13/13 0815 01/13/13 1420  WBC 6.0 6.8  NEUTROABS  --  3.7  HGB 13.0 12.9  HCT 38.9 38.6  MCV 92.6 93.0  PLT 274 298   Cardiac Enzymes:  Recent Labs Lab 01/13/13 1420  TROPONINI <0.30   CBG: No results found for this basename: GLUCAP,  in the last 168 hours  If 7PM-7AM, please contact night-coverage [email or web address] Password [facility] 01/13/2013, 3:44 PM

## 2013-01-13 NOTE — Care Management Note (Signed)
    Page 1 of 1   01/13/2013     1:01:02 PM   CARE MANAGEMENT NOTE 01/13/2013  Patient:  Tonya Cain, Tonya Cain   Account Number:  [id number]  Date Initiated:  01/13/2013  Documentation initiated by:  Lanier Clam  Subjective/Objective Assessment:   62 Y/O F ADMITTED W/CHEST PAIN.     Action/Plan:   FROM HOME.   Anticipated DC Date:  01/14/2013   Anticipated DC Plan:  HOME/SELF CARE      DC Planning Services  CM consult      Choice offered to / List presented to:             Status of service:  In process, will continue to follow Medicare Important Message given?   (If response is "NO", the following Medicare IM given date fields will be blank) Date Medicare IM given:   Date Additional Medicare IM given:    Discharge Disposition:    Per UR Regulation:  Reviewed for med. necessity/level of care/duration of stay  If discussed at Long Length of Stay Meetings, dates discussed:    Comments:  01/13/13 [facility] Rafael Quesada RN,BSN NCM [phone]

## 2013-01-13 NOTE — ED Provider Notes (Signed)
CSN: [id number]     Arrival date & time 01/13/13  0804 History   First MD Initiated Contact with Patient 01/13/13 0809     Chief Complaint  Patient presents with  . Chest Pain  . Neck Pain   (Consider location/radiation/quality/duration/timing/severity/associated sxs/prior Treatment) Patient is a 62 y.o. female presenting with chest pain and neck pain. The history is provided by the patient.  Chest Pain Neck Pain Associated symptoms: chest pain    patient here complaining of son onset of midsternal chest pain yesterday while at a store shopping. Symptoms last for possibly 10 minutes and were located in the upper chest were none radiating and associated with diaphoresis but no dyspnea. Symptoms got better when she rested. She awoke this morning and had persistent left-sided neck pain with out associated chest pain. Called her doctor and is here now. Denies any prior history of coronary artery disease but does have a history of CAD with a parent having a heart attack in their late 73s. She is currently chest pain-free at this time. Denies any fever or chills. No leg pains or swelling. No cough or congestion.  Past Medical History  Diagnosis Date  . RSD (reflex sympathetic dystrophy)    Past Surgical History  Procedure Laterality Date  . Knee surgery      right knee 1999, 2002, 2005, 2009   History reviewed. No pertinent family history. History  Substance Use Topics  . Smoking status: Never Smoker   . Smokeless tobacco: Not on file  . Alcohol Use: 0.6 oz/week    1 Glasses of wine per week   OB History   Grav Para Term Preterm Abortions TAB SAB Ect Mult Living                 Review of Systems  Cardiovascular: Positive for chest pain.  Musculoskeletal: Positive for neck pain.  All other systems reviewed and are negative.    Allergies  Review of patient's allergies indicates no known allergies.  Home Medications  No current outpatient prescriptions on file. BP 131/81   Pulse 59  Temp(Src) 97.9 F (36.6 C) (Oral)  Resp 16  Ht 5\' 4"  (1.626 m)  Wt 105 lb (47.628 kg)  BMI 18.01 kg/m2  SpO2 96% Physical Exam  Nursing note and vitals reviewed. Constitutional: She is oriented to person, place, and time. She appears well-developed and well-nourished.  Non-toxic appearance. No distress.  HENT:  Head: Normocephalic and atraumatic.  Eyes: Conjunctivae, EOM and lids are normal. Pupils are equal, round, and reactive to light.  Neck: Normal range of motion. Neck supple. No tracheal deviation present. No mass present.  Cardiovascular: Normal rate, regular rhythm and normal heart sounds.  Exam reveals no gallop.   No murmur heard. Pulmonary/Chest: Effort normal and breath sounds normal. No stridor. No respiratory distress. She has no decreased breath sounds. She has no wheezes. She has no rhonchi. She has no rales.  Abdominal: Soft. Normal appearance and bowel sounds are normal. She exhibits no distension. There is no tenderness. There is no rebound and no CVA tenderness.  Musculoskeletal: Normal range of motion. She exhibits no edema and no tenderness.  Neurological: She is alert and oriented to person, place, and time. She has normal strength. No cranial nerve deficit or sensory deficit. GCS eye subscore is 4. GCS verbal subscore is 5. GCS motor subscore is 6.  Skin: Skin is warm and dry. No abrasion and no rash noted.  Psychiatric: She has a normal mood  and affect. Her speech is normal and behavior is normal.    ED Course  Procedures (including critical care time) Labs Review Labs Reviewed  CBC  BASIC METABOLIC PANEL   Imaging Review No results found.  EKG Interpretation    Date/Time:  Wednesday January 13 2013 08:06:29 EST Ventricular Rate:  55 PR Interval:  177 QRS Duration: 82 QT Interval:  438 QTC Calculation: 419 R Axis:   37 Text Interpretation:  Sinus rhythm Anterior infarct, old No significant change since last tracing Confirmed by Gurfateh Mcclain   MD, Katarina Riebe ([id number]) on 01/13/2013 8:16:53 AM            MDM  No diagnosis found.   Pt to be admitted by medicine for futher eval  Toy Baker, MD 01/13/13 1134

## 2013-01-13 NOTE — ED Notes (Signed)
Bed: NG29 Expected date:  Expected time:  Means of arrival:  Comments: Triage chest pain

## 2013-01-13 NOTE — ED Notes (Signed)
Patient transported to X-ray 

## 2013-01-13 NOTE — ED Notes (Signed)
Pt reports she has been under stress selling her house, left sided chest pain yesterday, was at work almost passed out, became lightheaded, nausea, diaphoretic. Pain resolved, ems worker at work recommended pt go to ED, but pt did not. This morning pt woke up with left sided chest pain/ neck pain, at present 7/10. Denies SOB Pt father died at 53 from MI.  Denies smoking.

## 2013-01-13 NOTE — ED Notes (Signed)
MD at bedside. 

## 2013-01-14 DIAGNOSIS — I498 Other specified cardiac arrhythmias: Secondary | ICD-10-CM

## 2013-01-14 LAB — COMPREHENSIVE METABOLIC PANEL
AST: 16 U/L (ref 0–37)
Albumin: 3.4 g/dL — ABNORMAL LOW (ref 3.5–5.2)
BUN: 14 mg/dL (ref 6–23)
CO2: 27 mEq/L (ref 19–32)
Calcium: 9.3 mg/dL (ref 8.4–10.5)
Chloride: 104 mEq/L (ref 96–112)
Creatinine, Ser: 0.92 mg/dL (ref 0.50–1.10)
GFR calc Af Amer: 76 mL/min — ABNORMAL LOW (ref >90)
GFR calc non Af Amer: 65 mL/min — ABNORMAL LOW (ref >90)
Glucose, Bld: 95 mg/dL (ref 70–99)
Total Bilirubin: 0.7 mg/dL (ref 0.3–1.2)

## 2013-01-14 LAB — TROPONIN I: Troponin I: <0.3 ng/mL (ref <0.30)

## 2013-01-14 LAB — CBC
HCT: 37.9 % (ref 36.0–46.0)
Hemoglobin: 12.6 g/dL (ref 12.0–15.0)
Platelets: 262 10*3/uL (ref 150–400)
RBC: 4.07 MIL/uL (ref 3.87–5.11)
RDW: 12.7 % (ref 11.5–15.5)
WBC: 7.5 10*3/uL (ref 4.0–10.5)

## 2013-01-14 LAB — GLUCOSE, CAPILLARY: Glucose-Capillary: 89 mg/dL (ref 70–99)

## 2013-01-14 MED ORDER — HYDROCODONE-ACETAMINOPHEN 5-325 MG PO TABS: 1.0000 | ORAL_TABLET | ORAL | Status: DC | PRN

## 2013-01-14 NOTE — Progress Notes (Signed)
Patient discharged home, discharge instructions given and explained to patient and she verbalized understanding, Patient denies any chest pain/distress. Skin intact, no wound.

## 2013-01-14 NOTE — Discharge Summary (Signed)
Physician Discharge Summary  Tonya Cain ZOX:[id number] DOB: 01/19/1951 DOA: 01/13/2013  PCP: No primary provider on file.  Admit date: 01/13/2013 Discharge date: 01/14/2013  Recommendations for Outpatient Follow-up:  1. Pt will need to follow up with PCP in 2-3 weeks post discharge 2. Please obtain BMP to evaluate electrolytes and kidney function 3. Please also check CBC to evaluate Hg and Hct levels 4. Pt advised to schedule an appointment with cardiologist (information provided) as soon as possible   Discharge Diagnoses:  Active Problems:   Chest pain  Discharge Condition: Stable  Diet recommendation: Heart healthy diet discussed in details   History of present illness:  Pt is 62 yo female who presents to [facility] ED with main concern of sudden onset of midsternal area chest pain started 1 day prior to this admission, lasting 10 minutes and radiating to upper chest area and left neck area, 8/10 in severity and associated with diaphoresis. Patient reported pain resolved on its own. Pain came back this am and pt reported she was concerned something is wrong and she needed to get evaluated. Her both parents had angina and her father had died of heart attack. Pt denies similar events in the past, no fevers, chills, no shortness of breath, no abdominal or urinary concerns, no specific focal neurological symptoms. She explains she was in her usual state of heatlh, shopping in the store when symptoms suddenly occurred.   Assessment and Plan:  Active Problems:  Chest pain  - CE's x 3 negative, no events on telemetry since admission - no acute findings on 12 lead EKG - no indication for 2 D ECHO at this time - pt denies pain this AM - outpatient follow up with cardiologist recommended   Radiological Exams on Admission:  Dg Chest 2 View  01/13/2013 IMPRESSION: No acute abnormalities. No interval change. Electronically Signed By: Ulyses Southward M.D. On: 01/13/2013 10:18   EKG: Normal sinus  rhythm, no ST/T wave changes  Consultations:  None  Antibiotics:  None  Discharge Exam: Filed Vitals:   01/14/13 0527  BP: 106/65  Pulse: 72  Temp: 98.3 F (36.8 C)  Resp: 18   Filed Vitals:   01/13/13 1216 01/13/13 1332 01/13/13 2056 01/14/13 0527  BP: 111/69 115/70 108/58 106/65  Pulse: 60 55 55 72  Temp: 98.5 F (36.9 C) 98 F (36.7 C) 98.1 F (36.7 C) 98.3 F (36.8 C)  TempSrc: Oral Oral Oral Oral  Resp: 16 19 18 18   Height:  5\' 4"  (1.626 m)    Weight:  48.399 kg (106 lb 11.2 oz)    SpO2: 96% 97% 99% 97%    General: Pt is alert, follows commands appropriately, not in acute distress Cardiovascular: Regular rate and rhythm, S1/S2 +, no murmurs, no rubs, no gallops Respiratory: Clear to auscultation bilaterally, no wheezing, no crackles, no rhonchi Abdominal: Soft, non tender, non distended, bowel sounds +, no guarding Extremities: no edema, no cyanosis, pulses palpable bilaterally DP and PT Neuro: Grossly nonfocal  Discharge Instructions  Discharge Orders   Future Orders Complete By Expires   Diet - low sodium heart healthy  As directed    Increase activity slowly  As directed        Medication List         BUTRANS 20 MCG/HR Ptwk patch  Generic drug:  buprenorphine  Place 20 mcg onto the skin once a week.     celecoxib 200 MG capsule  Commonly known as:  CELEBREX  Take  200 mg by mouth 2 (two) times daily as needed for mild pain or moderate pain.     HYDROcodone-acetaminophen 5-325 MG per tablet  Commonly known as:  NORCO/VICODIN  Take 1-2 tablets by mouth every 4 (four) hours as needed for moderate pain.     traZODone 150 MG tablet  Commonly known as:  DESYREL  Take 150 mg by mouth at bedtime.           Follow-up Information   Follow up with Debbora Presto, MD. (As needed if symptoms worsen call my cell phone [phone])    Specialty:  Internal Medicine   Contact information:   [address] [place]  [address] [phone]       Follow up with [facility]). (As needed if symptoms worsen)    Specialty:  Cardiology   Contact information:   [address] [place] [address] [phone]       The results of significant diagnostics from this hospitalization (including imaging, microbiology, ancillary and laboratory) are listed below for reference.     Microbiology: No results found for this or any previous visit (from the past 240 hour(s)).   Labs: Basic Metabolic Panel:  Recent Labs Lab 01/13/13 0815 01/13/13 1420 01/14/13 0433  NA 136 137 138  K 3.8 3.7 4.1  CL 100 102 104  CO2 27 27 27   GLUCOSE 96 111* 95  BUN 13 15 14   CREATININE 0.78 0.93 0.92  CALCIUM 9.3 9.1 9.3  MG  --  2.0  --   PHOS  --  3.5  --    Liver Function Tests:  Recent Labs Lab 01/13/13 1420 01/14/13 0433  AST 18 16  ALT 11 10  ALKPHOS 51 49  BILITOT 0.6 0.7  PROT 6.3 6.1  ALBUMIN 3.4* 3.4*   CBC:  Recent Labs Lab 01/13/13 0815 01/13/13 1420 01/14/13 0433  WBC 6.0 6.8 7.5  NEUTROABS  --  3.7  --   HGB 13.0 12.9 12.6  HCT 38.9 38.6 37.9  MCV 92.6 93.0 93.1  PLT 274 298 262   Cardiac Enzymes:  Recent Labs Lab 01/13/13 1420 01/13/13 1850 01/14/13 0029  TROPONINI <0.30 <0.30 <0.30   SIGNED: Time coordinating discharge: Over 30 minutes  Debbora Presto, MD  [facility] 01/14/2013, 7:33 AM Pager [phone]  If 7PM-7AM, please contact night-coverage [email or web address] Password [email or web address]

## 2013-01-27 ENCOUNTER — Encounter: Payer: Self-pay | Admitting: Cardiology

## 2013-01-27 ENCOUNTER — Ambulatory Visit (INDEPENDENT_AMBULATORY_CARE_PROVIDER_SITE_OTHER): Payer: Commercial Indemnity | Admitting: Cardiology

## 2013-01-27 VITALS — BP 125/74 | HR 65 | Ht 64.0 in | Wt 112.0 lb

## 2013-01-27 DIAGNOSIS — R42 Dizziness and giddiness: Secondary | ICD-10-CM

## 2013-01-27 DIAGNOSIS — R079 Chest pain, unspecified: Secondary | ICD-10-CM

## 2013-01-27 DIAGNOSIS — R001 Bradycardia, unspecified: Secondary | ICD-10-CM

## 2013-01-27 DIAGNOSIS — I498 Other specified cardiac arrhythmias: Secondary | ICD-10-CM

## 2013-01-27 NOTE — Addendum Note (Signed)
**Note De-Identified Sarea Fyfe Obfuscation** Addended by: Demetrios Loll on: 01/27/2013 12:27 PM   Modules accepted: Orders

## 2013-01-27 NOTE — Patient Instructions (Addendum)
**Note De-Identified Lando Alcalde Obfuscation** Your physician has requested that you have an echocardiogram. Echocardiography is a painless test that uses sound waves to create images of your heart. It provides your doctor with information about the size and shape of your heart and how well your heart's chambers and valves are working. This procedure takes approximately one hour. There are no restrictions for this procedure. If the patients insurance does not cover this test, per Dr Delton See, it is ok to cancel.  Your physician has requested that you have cardiac CT. Cardiac computed tomography (CT) is a painless test that uses an x-ray machine to take clear, detailed pictures of your heart. For further information please visit [email or web address] Please follow instruction sheet as given.  Your physician has recommended that you wear a 24 hour holter monitor. Holter monitors are medical devices that record the heart's electrical activity. Doctors most often use these monitors to diagnose arrhythmias. Arrhythmias are problems with the speed or rhythm of the heartbeat. The monitor is a small, portable device. You can wear one while you do your normal daily activities. This is usually used to diagnose what is causing palpitations/syncope (passing out).  Your physician recommends that you schedule a follow-up appointment in: after tests.

## 2013-01-27 NOTE — Progress Notes (Signed)
Patient ID: MILESSA HOGAN, female   DOB: 1950/03/29, 62 y.o.   MRN: [id number]    Patient Name: Tonya Cain Date of Encounter: 01/27/2013  Primary Care Provider:  Mickie Hillier, MD Primary Cardiologist:  Tobias Alexander, H  Problem List   Past Medical History  Diagnosis Date  . RSD (reflex sympathetic dystrophy)    Past Surgical History  Procedure Laterality Date  . Knee surgery      right knee 1999, 2002, 2005, 2009    Allergies  No Known Allergies  HPI  62 year old female with no significant prior medical history who was seen in the ER for an episode of sudden onset of midsternal area chest pain started 1 day prior to this admission, lasting 10 minutes and radiating to upper chest area and left neck area, 8/10 in severity and associated with diaphoresis. Patient reported pain resolved on its own. Pain came back this am and pt reported she was concerned something is wrong and she needed to get evaluated. Her both parents had angina and her father had died of heart attack. Pt denies similar events in the past, no fevers, chills, no shortness of breath, no abdominal or urinary concerns, no specific focal neurological symptoms. She explains she was in her usual state of heatlh, shopping in the store when symptoms suddenly occurred. The patient is quite active, involved in Zumba and dance classes and doesn't experience limitations during those activities. She also states that while she was in the ER there was concern about her bradycardia while she was asleep. She admits to feeling dizzy after she awoke at night yesterday.   Home Medications  Prior to Admission medications   Medication Sig Start Date End Date Taking? Authorizing Provider  buprenorphine (BUTRANS) 20 MCG/HR PTWK patch Place 20 mcg onto the skin once a week.   Yes Historical Provider, MD  celecoxib (CELEBREX) 200 MG capsule Take 200 mg by mouth 2 (two) times daily as needed for mild pain or moderate pain.    Yes Historical Provider, MD  HYDROcodone-acetaminophen (NORCO/VICODIN) 5-325 MG per tablet Take 1-2 tablets by mouth every 4 (four) hours as needed for moderate pain. 01/14/13  Yes Dorothea Ogle, MD  omeprazole (PRILOSEC) 20 MG capsule Take 1 tab daily 12/17/12  Yes Historical Provider, MD  traZODone (DESYREL) 150 MG tablet Take 150 mg by mouth at bedtime.   Yes Historical Provider, MD    Family History  No family history on file.  Social History  History   Social History  . Marital Status: Married    Spouse Name: N/A    Number of Children: N/A  . Years of Education: N/A   Occupational History  . Not on file.   Social History Main Topics  . Smoking status: Never Smoker   . Smokeless tobacco: Never Used  . Alcohol Use: 0.6 oz/week    1 Glasses of wine per week  . Drug Use: No  . Sexual Activity: No   Other Topics Concern  . Not on file   Social History Narrative  . No narrative on file     Review of Systems, as per HPI, otherwise negative General:  No chills, fever, night sweats or weight changes.  Cardiovascular:  No chest pain, dyspnea on exertion, edema, orthopnea, palpitations, paroxysmal nocturnal dyspnea. Dermatological: No rash, lesions/masses Respiratory: No cough, dyspnea Urologic: No hematuria, dysuria Abdominal:   No nausea, vomiting, diarrhea, bright red blood per rectum, melena, or hematemesis Neurologic:  No  visual changes, wkns, changes in mental status. All other systems reviewed and are otherwise negative except as noted above.  Physical Exam  Blood pressure 125/74, pulse 65, height 5\' 4"  (1.626 m), weight 112 lb (50.803 kg).  General: Pleasant, NAD Psych: Normal affect. Neuro: Alert and oriented X 3. Moves all extremities spontaneously. HEENT: Normal  Neck: Supple without bruits or JVD. Lungs:  Resp regular and unlabored, CTA. Heart: RRR no s3, s4, or murmurs. Abdomen: Soft, non-tender, non-distended, BS + x 4.  Extremities: No clubbing,  cyanosis or edema. DP/PT/Radials 2+ and equal bilaterally.  Labs:  No results found for this basename: CKTOTAL, CKMB, TROPONINI,  in the last 72 hours Lab Results  Component Value Date   WBC 7.5 01/14/2013   HGB 12.6 01/14/2013   HCT 37.9 01/14/2013   MCV 93.1 01/14/2013   PLT 262 01/14/2013   No results found for this basename: NA, K, CL, CO2, BUN, CREATININE, CALCIUM, LABALBU, PROT, BILITOT, ALKPHOS, ALT, AST, GLUCOSE,  in the last 168 hours No results found for this basename: CHOL, HDL, LDLCALC, TRIG   Lab Results  Component Value Date   DDIMER  Value: 0.26        AT THE INHOUSE ESTABLISHED CUTOFF VALUE OF 0.48 ug/mL FEU, THIS ASSAY HAS BEEN DOCUMENTED IN THE LITERATURE TO HAVE A SENSITIVITY AND NEGATIVE PREDICTIVE VALUE OF AT LEAST 98 TO 99%.  THE TEST RESULT SHOULD BE CORRELATED WITH AN ASSESSMENT OF THE CLINICAL PROBABILITY OF DVT / VTE. 05/24/2008   Lipid Panel  No results found for this basename: chol, trig, hdl, cholhdl, vldl, ldlcalc      Accessory Clinical Findings  echocardiogram  ECG - SR, possible old anterior MI   Assessment & Plan  A 62 year old younger appearing female   1. Chest pain - rather atypical, but considering her significant family history and possible prior anterior MI on ECG we will order a coronary CT. We will also order an echocardiogram to evaluate for possible WMA.  2. Bradycardia - with dizziness, we will order 24 Hour Holter monitor  3. BP - well controlled  4. Lipids - based on patient it was normal, we will obtain from PCP office  Follow up in 2 weeks.   Tobias Alexander, Rexene Edison, MD, [facility] 01/27/2013, 11:29 AM

## 2013-01-28 ENCOUNTER — Encounter: Payer: Self-pay | Admitting: *Deleted

## 2013-01-28 ENCOUNTER — Encounter (INDEPENDENT_AMBULATORY_CARE_PROVIDER_SITE_OTHER): Payer: Medicare Other

## 2013-01-28 DIAGNOSIS — I495 Sick sinus syndrome: Secondary | ICD-10-CM

## 2013-01-28 DIAGNOSIS — R42 Dizziness and giddiness: Secondary | ICD-10-CM

## 2013-01-28 DIAGNOSIS — R001 Bradycardia, unspecified: Secondary | ICD-10-CM

## 2013-01-28 NOTE — Progress Notes (Signed)
Patient ID: Tonya Cain, female   DOB: 04/20/1950, 63 y.o.   MRN: [id number] E-Cardio 24 hour holter monitor applied to patient.

## 2013-01-28 NOTE — Addendum Note (Signed)
Addended by: Demetrios Loll on: 01/28/2013 09:14 AM   Modules accepted: Orders

## 2013-02-02 ENCOUNTER — Ambulatory Visit: Payer: Commercial Indemnity | Admitting: Cardiology

## 2013-02-03 ENCOUNTER — Telehealth: Payer: Self-pay | Admitting: Cardiology

## 2013-02-03 NOTE — Telephone Encounter (Signed)
Pt came into office to cancel CTA scheduled for 02/25/2013, asked pt if she wanted to reschedule pt advised no, she said she was on in the hosp for 1 day and the bill was $6100, cant reschedule due to cost.

## 2013-02-08 NOTE — Telephone Encounter (Signed)
Will send note to Dr Delton See as Lorain Childes.

## 2013-02-17 ENCOUNTER — Telehealth: Payer: Self-pay | Admitting: *Deleted

## 2013-02-17 NOTE — Telephone Encounter (Signed)
Pt is aware of normal monitor results as read by Dr. Henderson Newcomer RN

## 2013-02-25 ENCOUNTER — Ambulatory Visit (HOSPITAL_COMMUNITY): Payer: Medicare Other

## 2013-02-26 ENCOUNTER — Telehealth: Payer: Self-pay

## 2013-02-26 NOTE — Telephone Encounter (Signed)
**Note De-Identified Burleigh Brockmann Obfuscation** I called pt to schedule a stress echo per Dr Meda Coffee. The pt states that when she called on 02/03/13 to cancel  the cardiac CT (Dr Meda Coffee also ordered at [facility] on 01/27/13) that she also canceled the Echo and her f/u appt with Dr Meda Coffee due cost. She states that she owes the hospital $6100 for a 1 day stay. I explained the importance of her keeping her f/u visit with Dr Meda Coffee and offered to reschedule her an appt but the pt refused stating that the 24 hour holter monitor that she wore was ok and that if she has any cardiac issues in the future she will contact us.

## 2014-02-14 ENCOUNTER — Other Ambulatory Visit: Payer: Self-pay | Admitting: Anesthesiology

## 2014-02-14 DIAGNOSIS — R51 Headache: Secondary | ICD-10-CM

## 2014-02-14 DIAGNOSIS — G4486 Cervicogenic headache: Secondary | ICD-10-CM

## 2014-02-14 DIAGNOSIS — G894 Chronic pain syndrome: Secondary | ICD-10-CM

## 2014-02-22 ENCOUNTER — Ambulatory Visit
Admission: RE | Admit: 2014-02-22 | Discharge: 2014-02-22 | Disposition: A | Discharge status: Home / Self Care | Payer: PRIVATE HEALTH INSURANCE | Source: Ambulatory Visit | Attending: Anesthesiology | Admitting: Anesthesiology

## 2014-02-22 DIAGNOSIS — G894 Chronic pain syndrome: Secondary | ICD-10-CM

## 2014-02-22 DIAGNOSIS — G4486 Cervicogenic headache: Secondary | ICD-10-CM

## 2014-02-22 DIAGNOSIS — R51 Headache: Secondary | ICD-10-CM

## 2014-02-25 ENCOUNTER — Other Ambulatory Visit: Payer: Self-pay | Admitting: Pain Medicine

## 2014-02-25 DIAGNOSIS — M542 Cervicalgia: Secondary | ICD-10-CM

## 2014-03-01 ENCOUNTER — Ambulatory Visit
Admission: RE | Admit: 2014-03-01 | Discharge: 2014-03-01 | Disposition: A | Discharge status: Home / Self Care | Payer: PRIVATE HEALTH INSURANCE | Source: Ambulatory Visit | Attending: *Deleted | Admitting: *Deleted

## 2014-03-01 DIAGNOSIS — M542 Cervicalgia: Secondary | ICD-10-CM

## 2014-03-01 MED ORDER — ONDANSETRON HCL 4 MG/2ML IJ SOLN
4.0000 mg | Freq: Once | INTRAMUSCULAR | Status: AC
  Administered 2014-03-01: 4 mg via INTRAMUSCULAR

## 2014-03-01 MED ORDER — ONDANSETRON HCL 4 MG/2ML IJ SOLN: 4.0000 mg | Freq: Four times a day (QID) | INTRAMUSCULAR | Status: DC | PRN

## 2014-03-01 MED ORDER — IOHEXOL 300 MG/ML  SOLN
10.0000 mL | Freq: Once | INTRAMUSCULAR | Status: AC | PRN
  Administered 2014-03-01: 10 mL via INTRATHECAL

## 2014-03-01 MED ORDER — DIAZEPAM 5 MG PO TABS
5.0000 mg | ORAL_TABLET | Freq: Once | ORAL | Status: AC
  Administered 2014-03-01: 5 mg via ORAL

## 2014-03-01 MED ORDER — MEPERIDINE HCL 100 MG/ML IJ SOLN
100.0000 mg | Freq: Once | INTRAMUSCULAR | Status: AC
  Administered 2014-03-01: 100 mg via INTRAMUSCULAR

## 2014-03-01 NOTE — Progress Notes (Signed)
Patient states she has been off Trazodone and Nucynta for at least the past two days.  jkl

## 2014-03-01 NOTE — Discharge Instructions (Signed)
Myelogram Discharge Instructions  1. Go home and rest quietly for the next 24 hours.  It is important to lie flat for the next 24 hours.  Get up only to go to the restroom.  You may lie in the bed or on a couch on your back, your stomach, your left side or your right side.  You may have one pillow under your head.  You may have pillows between your knees while you are on your side or under your knees while you are on your back.  2. DO NOT drive today.  Recline the seat as far back as it will go, while still wearing your seat belt, on the way home.  3. You may get up to go to the bathroom as needed.  You may sit up for 10 minutes to eat.  You may resume your normal diet and medications unless otherwise indicated.  Drink lots of extra fluids today and tomorrow.  4. The incidence of headache, nausea, or vomiting is about 5% (one in 20 patients).  If you develop a headache, lie flat and drink plenty of fluids until the headache goes away.  Caffeinated beverages may be helpful.  If you develop severe nausea and vomiting or a headache that does not go away with flat bed rest, call [phone].  5. You may resume normal activities after your 24 hours of bed rest is over; however, do not exert yourself strongly or do any heavy lifting tomorrow. If when you get up you have a headache when standing, go back to bed and force fluids for another 24 hours.  6. Call your physician for a follow-up appointment.  The results of your myelogram will be sent directly to your physician by the following day.  7. If you have any questions or if complications develop after you arrive home, please call [phone].  Discharge instructions have been explained to the patient.  The patient, or the person responsible for the patient, fully understands these instructions.       May resume Trazodone and Nucynta on Jan. 13, 2016, after 9:30 am.

## 2014-03-02 ENCOUNTER — Other Ambulatory Visit: Payer: PRIVATE HEALTH INSURANCE

## 2014-12-26 ENCOUNTER — Encounter: Payer: Self-pay | Admitting: *Deleted

## 2014-12-28 ENCOUNTER — Ambulatory Visit (INDEPENDENT_AMBULATORY_CARE_PROVIDER_SITE_OTHER): Payer: PRIVATE HEALTH INSURANCE | Admitting: *Deleted

## 2014-12-28 DIAGNOSIS — I8393 Asymptomatic varicose veins of bilateral lower extremities: Secondary | ICD-10-CM

## 2014-12-28 NOTE — Progress Notes (Signed)
Pt came in wanting sclero on some prominent veins on the front of both calves. I had Dr. Scot Dock consult. I was concerned that by injecting them I would also close the GSV's since the veins appear to be branches off the GSV's .She is very thin and has many prominent veins. In the end, she decided against treatment and stated that she appreciated our honesty. Will follow prn.

## 2015-05-22 DIAGNOSIS — Z1231 Encounter for screening mammogram for malignant neoplasm of breast: Secondary | ICD-10-CM | POA: Diagnosis not present

## 2015-05-22 DIAGNOSIS — Z803 Family history of malignant neoplasm of breast: Secondary | ICD-10-CM | POA: Diagnosis not present

## 2015-05-22 DIAGNOSIS — M8589 Other specified disorders of bone density and structure, multiple sites: Secondary | ICD-10-CM | POA: Diagnosis not present

## 2015-05-23 DIAGNOSIS — D239 Other benign neoplasm of skin, unspecified: Secondary | ICD-10-CM | POA: Diagnosis not present

## 2015-05-23 DIAGNOSIS — L570 Actinic keratosis: Secondary | ICD-10-CM | POA: Diagnosis not present

## 2015-05-23 DIAGNOSIS — H0019 Chalazion unspecified eye, unspecified eyelid: Secondary | ICD-10-CM | POA: Diagnosis not present

## 2015-06-21 DIAGNOSIS — G90521 Complex regional pain syndrome I of right lower limb: Secondary | ICD-10-CM | POA: Diagnosis not present

## 2015-06-21 DIAGNOSIS — G894 Chronic pain syndrome: Secondary | ICD-10-CM | POA: Diagnosis not present

## 2015-06-21 DIAGNOSIS — G479 Sleep disorder, unspecified: Secondary | ICD-10-CM | POA: Diagnosis not present

## 2015-06-29 DIAGNOSIS — L600 Ingrowing nail: Secondary | ICD-10-CM | POA: Diagnosis not present

## 2015-08-31 DIAGNOSIS — M542 Cervicalgia: Secondary | ICD-10-CM | POA: Diagnosis not present

## 2015-08-31 DIAGNOSIS — G90521 Complex regional pain syndrome I of right lower limb: Secondary | ICD-10-CM | POA: Diagnosis not present

## 2015-08-31 DIAGNOSIS — G894 Chronic pain syndrome: Secondary | ICD-10-CM | POA: Diagnosis not present

## 2015-08-31 DIAGNOSIS — Z9889 Other specified postprocedural states: Secondary | ICD-10-CM | POA: Diagnosis not present

## 2015-09-08 DIAGNOSIS — M2550 Pain in unspecified joint: Secondary | ICD-10-CM | POA: Diagnosis not present

## 2015-09-25 DIAGNOSIS — M533 Sacrococcygeal disorders, not elsewhere classified: Secondary | ICD-10-CM | POA: Diagnosis not present

## 2015-09-25 DIAGNOSIS — M47812 Spondylosis without myelopathy or radiculopathy, cervical region: Secondary | ICD-10-CM | POA: Diagnosis not present

## 2015-09-25 DIAGNOSIS — M2550 Pain in unspecified joint: Secondary | ICD-10-CM | POA: Diagnosis not present

## 2015-09-25 DIAGNOSIS — M452 Ankylosing spondylitis of cervical region: Secondary | ICD-10-CM | POA: Diagnosis not present

## 2015-09-25 DIAGNOSIS — G9050 Complex regional pain syndrome I, unspecified: Secondary | ICD-10-CM | POA: Diagnosis not present

## 2015-09-25 DIAGNOSIS — M5030 Other cervical disc degeneration, unspecified cervical region: Secondary | ICD-10-CM | POA: Diagnosis not present

## 2016-01-29 DIAGNOSIS — S0502XA Injury of conjunctiva and corneal abrasion without foreign body, left eye, initial encounter: Secondary | ICD-10-CM | POA: Diagnosis not present

## 2016-01-30 DIAGNOSIS — S0502XD Injury of conjunctiva and corneal abrasion without foreign body, left eye, subsequent encounter: Secondary | ICD-10-CM | POA: Diagnosis not present

## 2016-02-07 DIAGNOSIS — Z79899 Other long term (current) drug therapy: Secondary | ICD-10-CM | POA: Diagnosis not present

## 2016-02-07 DIAGNOSIS — G894 Chronic pain syndrome: Secondary | ICD-10-CM | POA: Diagnosis not present

## 2016-02-07 DIAGNOSIS — Z5181 Encounter for therapeutic drug level monitoring: Secondary | ICD-10-CM | POA: Diagnosis not present

## 2016-02-07 DIAGNOSIS — F322 Major depressive disorder, single episode, severe without psychotic features: Secondary | ICD-10-CM | POA: Diagnosis not present

## 2016-02-07 DIAGNOSIS — Z9889 Other specified postprocedural states: Secondary | ICD-10-CM | POA: Diagnosis not present

## 2016-02-07 DIAGNOSIS — G90521 Complex regional pain syndrome I of right lower limb: Secondary | ICD-10-CM | POA: Diagnosis not present

## 2016-02-22 ENCOUNTER — Other Ambulatory Visit (HOSPITAL_COMMUNITY)
Admission: RE | Admit: 2016-02-22 | Discharge: 2016-02-22 | Disposition: A | Discharge status: Home / Self Care | Payer: Medicare Other | Source: Ambulatory Visit | Attending: Family Medicine | Admitting: Family Medicine

## 2016-02-22 ENCOUNTER — Other Ambulatory Visit: Payer: Self-pay | Admitting: Family Medicine

## 2016-02-22 DIAGNOSIS — Z8619 Personal history of other infectious and parasitic diseases: Secondary | ICD-10-CM | POA: Diagnosis not present

## 2016-02-22 DIAGNOSIS — M899 Disorder of bone, unspecified: Secondary | ICD-10-CM | POA: Diagnosis not present

## 2016-02-22 DIAGNOSIS — Z124 Encounter for screening for malignant neoplasm of cervix: Secondary | ICD-10-CM | POA: Insufficient documentation

## 2016-02-22 DIAGNOSIS — Z1151 Encounter for screening for human papillomavirus (HPV): Secondary | ICD-10-CM | POA: Diagnosis not present

## 2016-02-22 DIAGNOSIS — J309 Allergic rhinitis, unspecified: Secondary | ICD-10-CM | POA: Diagnosis not present

## 2016-02-22 DIAGNOSIS — M85839 Other specified disorders of bone density and structure, unspecified forearm: Secondary | ICD-10-CM | POA: Diagnosis not present

## 2016-02-22 DIAGNOSIS — Z79899 Other long term (current) drug therapy: Secondary | ICD-10-CM | POA: Diagnosis not present

## 2016-02-22 DIAGNOSIS — Z87898 Personal history of other specified conditions: Secondary | ICD-10-CM | POA: Diagnosis not present

## 2016-02-22 DIAGNOSIS — F338 Other recurrent depressive disorders: Secondary | ICD-10-CM | POA: Diagnosis not present

## 2016-02-22 DIAGNOSIS — Z23 Encounter for immunization: Secondary | ICD-10-CM | POA: Diagnosis not present

## 2016-02-22 DIAGNOSIS — Z0000 Encounter for general adult medical examination without abnormal findings: Secondary | ICD-10-CM | POA: Diagnosis not present

## 2016-02-22 DIAGNOSIS — G9050 Complex regional pain syndrome I, unspecified: Secondary | ICD-10-CM | POA: Diagnosis not present

## 2016-02-22 DIAGNOSIS — R8290 Unspecified abnormal findings in urine: Secondary | ICD-10-CM | POA: Diagnosis not present

## 2016-02-26 LAB — CYTOLOGY - PAP
DIAGNOSIS: NEGATIVE
HPV: DETECTED — AB

## 2016-06-04 DIAGNOSIS — Z85828 Personal history of other malignant neoplasm of skin: Secondary | ICD-10-CM | POA: Diagnosis not present

## 2016-06-04 DIAGNOSIS — L821 Other seborrheic keratosis: Secondary | ICD-10-CM | POA: Diagnosis not present

## 2016-06-04 DIAGNOSIS — D229 Melanocytic nevi, unspecified: Secondary | ICD-10-CM | POA: Diagnosis not present

## 2016-06-04 DIAGNOSIS — L570 Actinic keratosis: Secondary | ICD-10-CM | POA: Diagnosis not present

## 2016-07-04 DIAGNOSIS — R87810 Cervical high risk human papillomavirus (HPV) DNA test positive: Secondary | ICD-10-CM | POA: Diagnosis not present

## 2016-07-04 DIAGNOSIS — B977 Papillomavirus as the cause of diseases classified elsewhere: Secondary | ICD-10-CM | POA: Diagnosis not present

## 2016-07-29 DIAGNOSIS — L600 Ingrowing nail: Secondary | ICD-10-CM | POA: Diagnosis not present

## 2016-07-30 ENCOUNTER — Encounter: Payer: Self-pay | Admitting: Podiatry

## 2016-07-30 ENCOUNTER — Ambulatory Visit (INDEPENDENT_AMBULATORY_CARE_PROVIDER_SITE_OTHER): Payer: Medicare Other | Admitting: Podiatry

## 2016-07-30 VITALS — Ht 64.0 in | Wt 112.0 lb

## 2016-07-30 DIAGNOSIS — L600 Ingrowing nail: Secondary | ICD-10-CM

## 2016-07-30 DIAGNOSIS — L03032 Cellulitis of left toe: Secondary | ICD-10-CM | POA: Diagnosis not present

## 2016-07-30 DIAGNOSIS — M79672 Pain in left foot: Secondary | ICD-10-CM

## 2016-07-30 MED ORDER — CEPHALEXIN 500 MG PO CAPS: 500.0000 mg | ORAL_CAPSULE | Freq: Three times a day (TID) | ORAL | 0 refills | Status: DC

## 2016-07-30 NOTE — Patient Instructions (Signed)
Ingrown nail surgery was done on left great toe lateral border. Follow soaking instruction.  Some redness and drainage is expected. Call the office if the area gets feverish with increased redness and drainage. Take antibiotics as scheduled. Return in one week.

## 2016-07-30 NOTE — Progress Notes (Signed)
SUBJECTIVE: 66 y.o. 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 Stated that she has had multiple temporary procedures done on the ingrown nail in past. Patient is requesting permanent procedure to stop the ongoing ingrown nail problem. Patient is referred by Dr. Leonette Nutting.  REVIEW OF SYSTEMS: A comprehensive review of systems was negative except for: RSD since age 46 following a Staph infection.  OBJECTIVE: DERMATOLOGIC EXAMINATION: Nails: Chronic ingrown nail left great toe lateral border with pain and swelling. Yellow discolored nails x 10.  VASCULAR EXAMINATION OF LOWER LIMBS: Pedal pulses: All pedal pulses are palpable with normal pulsation.  Capillary Filling times within 3 seconds in all digits.  Temperature gradient from tibial crest to dorsum of foot is within normal bilateral.  NEUROLOGIC EXAMINATION OF THE LOWER LIMBS: All epicritic and tactile sensations grossly intact. Sharp and Dull discriminatory sensations at the plantar ball of hallux is intact bilateral.   MUSCULOSKELETAL EXAMINATION: No gross deformities noted.  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 Pain in left great toe.  PLAN: Reviewed clinical findings and available treatment options. As per request P&A Matrixectomy done on left great toe lateral border. Procedure done as follow: Affected left great toe was anesthetized with total 47ml mixture of 50/50 0.5% Marcaine plain and 1% Xylocaine plain. Affected left great toe lateral nail border was reflected with a nail elevator and excised with nail nipper. Proximal nail matrix tissue was cauterized with Phenol soaked cotton applicator x 4 and neutralized with Alcohol soaked cotton applicator. The wound was dressed with Amerigel ointment dressing. Home care instructions and supply dispensed.  Take antibiotics as prescribed. Return in 1 week for follow up.

## 2016-08-06 ENCOUNTER — Encounter: Payer: Medicare Other | Admitting: Podiatry

## 2016-08-15 DIAGNOSIS — M7701 Medial epicondylitis, right elbow: Secondary | ICD-10-CM | POA: Diagnosis not present

## 2016-09-27 DIAGNOSIS — R413 Other amnesia: Secondary | ICD-10-CM | POA: Diagnosis not present

## 2016-09-27 DIAGNOSIS — M25529 Pain in unspecified elbow: Secondary | ICD-10-CM | POA: Diagnosis not present

## 2016-09-27 DIAGNOSIS — F338 Other recurrent depressive disorders: Secondary | ICD-10-CM | POA: Diagnosis not present

## 2016-10-10 DIAGNOSIS — H2513 Age-related nuclear cataract, bilateral: Secondary | ICD-10-CM | POA: Diagnosis not present

## 2017-01-14 DIAGNOSIS — M25551 Pain in right hip: Secondary | ICD-10-CM | POA: Diagnosis not present

## 2017-01-14 DIAGNOSIS — F338 Other recurrent depressive disorders: Secondary | ICD-10-CM | POA: Diagnosis not present

## 2017-01-16 DIAGNOSIS — M25551 Pain in right hip: Secondary | ICD-10-CM | POA: Diagnosis not present

## 2017-01-20 DIAGNOSIS — M25551 Pain in right hip: Secondary | ICD-10-CM | POA: Diagnosis not present

## 2017-01-23 DIAGNOSIS — M25551 Pain in right hip: Secondary | ICD-10-CM | POA: Diagnosis not present

## 2017-01-30 DIAGNOSIS — M25551 Pain in right hip: Secondary | ICD-10-CM | POA: Diagnosis not present

## 2017-02-05 DIAGNOSIS — M7701 Medial epicondylitis, right elbow: Secondary | ICD-10-CM | POA: Diagnosis not present

## 2017-02-05 DIAGNOSIS — F419 Anxiety disorder, unspecified: Secondary | ICD-10-CM | POA: Diagnosis not present

## 2017-02-06 DIAGNOSIS — M25551 Pain in right hip: Secondary | ICD-10-CM | POA: Diagnosis not present

## 2017-02-14 DIAGNOSIS — M25551 Pain in right hip: Secondary | ICD-10-CM | POA: Diagnosis not present

## 2017-05-07 DIAGNOSIS — G90521 Complex regional pain syndrome I of right lower limb: Secondary | ICD-10-CM | POA: Diagnosis not present

## 2017-05-07 DIAGNOSIS — G894 Chronic pain syndrome: Secondary | ICD-10-CM | POA: Diagnosis not present

## 2017-05-07 DIAGNOSIS — G8929 Other chronic pain: Secondary | ICD-10-CM | POA: Diagnosis not present

## 2017-05-07 DIAGNOSIS — M25562 Pain in left knee: Secondary | ICD-10-CM | POA: Diagnosis not present

## 2017-06-04 DIAGNOSIS — G894 Chronic pain syndrome: Secondary | ICD-10-CM | POA: Diagnosis not present

## 2017-06-04 DIAGNOSIS — G8929 Other chronic pain: Secondary | ICD-10-CM | POA: Diagnosis not present

## 2017-06-04 DIAGNOSIS — M25561 Pain in right knee: Secondary | ICD-10-CM | POA: Diagnosis not present

## 2017-06-04 DIAGNOSIS — Z9689 Presence of other specified functional implants: Secondary | ICD-10-CM | POA: Diagnosis not present

## 2017-06-04 DIAGNOSIS — G90521 Complex regional pain syndrome I of right lower limb: Secondary | ICD-10-CM | POA: Diagnosis not present

## 2017-07-17 DIAGNOSIS — R1033 Periumbilical pain: Secondary | ICD-10-CM | POA: Diagnosis not present

## 2017-07-21 DIAGNOSIS — G90521 Complex regional pain syndrome I of right lower limb: Secondary | ICD-10-CM | POA: Diagnosis not present

## 2017-07-22 ENCOUNTER — Other Ambulatory Visit: Payer: Self-pay | Admitting: Family Medicine

## 2017-07-22 DIAGNOSIS — R1033 Periumbilical pain: Secondary | ICD-10-CM

## 2017-07-25 DIAGNOSIS — H5203 Hypermetropia, bilateral: Secondary | ICD-10-CM | POA: Diagnosis not present

## 2017-07-25 DIAGNOSIS — H2513 Age-related nuclear cataract, bilateral: Secondary | ICD-10-CM | POA: Diagnosis not present

## 2017-07-25 DIAGNOSIS — H25013 Cortical age-related cataract, bilateral: Secondary | ICD-10-CM | POA: Diagnosis not present

## 2017-07-25 DIAGNOSIS — H524 Presbyopia: Secondary | ICD-10-CM | POA: Diagnosis not present

## 2017-07-29 DIAGNOSIS — G894 Chronic pain syndrome: Secondary | ICD-10-CM | POA: Diagnosis not present

## 2017-07-29 DIAGNOSIS — Z9689 Presence of other specified functional implants: Secondary | ICD-10-CM | POA: Diagnosis not present

## 2017-07-29 DIAGNOSIS — M25561 Pain in right knee: Secondary | ICD-10-CM | POA: Diagnosis not present

## 2017-07-29 DIAGNOSIS — G90521 Complex regional pain syndrome I of right lower limb: Secondary | ICD-10-CM | POA: Diagnosis not present

## 2017-08-01 ENCOUNTER — Ambulatory Visit
Admission: RE | Admit: 2017-08-01 | Discharge: 2017-08-01 | Disposition: A | Discharge status: Home / Self Care | Payer: Medicare Other | Source: Ambulatory Visit | Attending: Family Medicine | Admitting: Family Medicine

## 2017-08-01 DIAGNOSIS — R1033 Periumbilical pain: Secondary | ICD-10-CM

## 2017-08-01 DIAGNOSIS — N2889 Other specified disorders of kidney and ureter: Secondary | ICD-10-CM | POA: Diagnosis not present

## 2017-08-01 MED ORDER — IOPAMIDOL (ISOVUE-300) INJECTION 61%
100.0000 mL | Freq: Once | INTRAVENOUS | Status: AC | PRN
  Administered 2017-08-01: 100 mL via INTRAVENOUS

## 2017-08-13 DIAGNOSIS — D49512 Neoplasm of unspecified behavior of left kidney: Secondary | ICD-10-CM | POA: Diagnosis not present

## 2017-08-26 ENCOUNTER — Other Ambulatory Visit (HOSPITAL_COMMUNITY): Payer: Self-pay | Admitting: Family Medicine

## 2017-08-26 ENCOUNTER — Ambulatory Visit (HOSPITAL_COMMUNITY)
Admission: RE | Admit: 2017-08-26 | Discharge: 2017-08-26 | Disposition: A | Discharge status: Home / Self Care | Payer: Medicare Other | Source: Ambulatory Visit | Attending: Urology | Admitting: Urology

## 2017-08-26 ENCOUNTER — Other Ambulatory Visit: Payer: Self-pay | Admitting: Urology

## 2017-08-26 DIAGNOSIS — D49512 Neoplasm of unspecified behavior of left kidney: Secondary | ICD-10-CM

## 2017-08-26 DIAGNOSIS — C649 Malignant neoplasm of unspecified kidney, except renal pelvis: Secondary | ICD-10-CM | POA: Diagnosis not present

## 2017-08-26 DIAGNOSIS — C642 Malignant neoplasm of left kidney, except renal pelvis: Secondary | ICD-10-CM | POA: Diagnosis not present

## 2017-08-28 ENCOUNTER — Other Ambulatory Visit: Payer: Self-pay | Admitting: Urology

## 2017-08-28 DIAGNOSIS — D49512 Neoplasm of unspecified behavior of left kidney: Secondary | ICD-10-CM | POA: Diagnosis not present

## 2017-09-03 DIAGNOSIS — H2513 Age-related nuclear cataract, bilateral: Secondary | ICD-10-CM | POA: Diagnosis not present

## 2017-09-03 DIAGNOSIS — H25013 Cortical age-related cataract, bilateral: Secondary | ICD-10-CM | POA: Diagnosis not present

## 2017-09-10 DIAGNOSIS — H2512 Age-related nuclear cataract, left eye: Secondary | ICD-10-CM | POA: Diagnosis not present

## 2017-09-10 DIAGNOSIS — H25012 Cortical age-related cataract, left eye: Secondary | ICD-10-CM | POA: Diagnosis not present

## 2017-09-10 DIAGNOSIS — H2511 Age-related nuclear cataract, right eye: Secondary | ICD-10-CM | POA: Diagnosis not present

## 2017-09-10 DIAGNOSIS — H25011 Cortical age-related cataract, right eye: Secondary | ICD-10-CM | POA: Diagnosis not present

## 2017-09-24 DIAGNOSIS — H2512 Age-related nuclear cataract, left eye: Secondary | ICD-10-CM | POA: Diagnosis not present

## 2017-09-24 DIAGNOSIS — H25012 Cortical age-related cataract, left eye: Secondary | ICD-10-CM | POA: Diagnosis not present

## 2017-09-24 NOTE — Patient Instructions (Addendum)
TAKEELA PEIL  09/24/2017   Your procedure is scheduled on: 10-03-17  Report to [facility] Main  Entrance  Report to admitting at     1000  AM    Call this number if you have problems the morning of surgery [phone]   Remember: Do not eat food or drink liquids :After Midnight.     Take these medicines the morning of surgery with A SIP OF WATER: none                                You may not have any metal on your body including hair pins and              piercings  Do not wear jewelry, make-up, lotions, powders or perfumes, deodorant             Do not wear nail polish.  Do not shave  48 hours prior to surgery.            Do not bring valuables to the hospital. [place].  Contacts, dentures or bridgework may not be worn into surgery.  Leave suitcase in the car. After surgery it may be brought to your room.               Please read over the following fact sheets you were given: _____________________________________________________________________             [facility] - Preparing for Surgery Before surgery, you can play an important role.  Because skin is not sterile, your skin needs to be as free of germs as possible.  You can reduce the number of germs on your skin by washing with CHG (chlorahexidine gluconate) soap before surgery.  CHG is an antiseptic cleaner which kills germs and bonds with the skin to continue killing germs even after washing. Please DO NOT use if you have an allergy to CHG or antibacterial soaps.  If your skin becomes reddened/irritated stop using the CHG and inform your nurse when you arrive at Short Stay. Do not shave (including legs and underarms) for at least 48 hours prior to the first CHG shower.  You may shave your face/neck. Please follow these instructions carefully:  1.  Shower with CHG Soap the night before surgery and the  morning of Surgery.  2.  If you choose  to wash your hair, wash your hair first as usual with your  normal  shampoo.  3.  After you shampoo, rinse your hair and body thoroughly to remove the  shampoo.                           4.  Use CHG as you would any other liquid soap.  You can apply chg directly  to the skin and wash                       Gently with a scrungie or clean washcloth.  5.  Apply the CHG Soap to your body ONLY FROM THE NECK DOWN.   Do not use on face/ open  Wound or open sores. Avoid contact with eyes, ears mouth and genitals (private parts).                       Wash face,  Genitals (private parts) with your normal soap.             6.  Wash thoroughly, paying special attention to the area where your surgery  will be performed.  7.  Thoroughly rinse your body with warm water from the neck down.  8.  DO NOT shower/wash with your normal soap after using and rinsing off  the CHG Soap.                9.  Pat yourself dry with a clean towel.            10.  Wear clean pajamas.            11.  Place clean sheets on your bed the night of your first shower and do not  sleep with pets. Day of Surgery : Do not apply any lotions/deodorants the morning of surgery.  Please wear clean clothes to the hospital/surgery center.  FAILURE TO FOLLOW THESE INSTRUCTIONS MAY RESULT IN THE CANCELLATION OF YOUR SURGERY PATIENT SIGNATURE_________________________________  NURSE SIGNATURE__________________________________  ________________________________________________________________________

## 2017-09-25 ENCOUNTER — Other Ambulatory Visit: Payer: Self-pay

## 2017-09-25 ENCOUNTER — Encounter (HOSPITAL_COMMUNITY)
Admission: RE | Admit: 2017-09-25 | Discharge: 2017-09-25 | Disposition: A | Discharge status: Home / Self Care | Payer: Medicare Other | Source: Ambulatory Visit | Attending: Urology | Admitting: Urology

## 2017-09-25 ENCOUNTER — Encounter (HOSPITAL_COMMUNITY): Payer: Self-pay

## 2017-09-25 DIAGNOSIS — N2889 Other specified disorders of kidney and ureter: Secondary | ICD-10-CM | POA: Insufficient documentation

## 2017-09-25 DIAGNOSIS — Z01812 Encounter for preprocedural laboratory examination: Secondary | ICD-10-CM | POA: Diagnosis not present

## 2017-09-25 HISTORY — DX: Anxiety disorder, unspecified: F41.9

## 2017-09-25 HISTORY — DX: Unspecified osteoarthritis, unspecified site: M19.90

## 2017-09-25 HISTORY — DX: Chronic kidney disease, unspecified: N18.9

## 2017-09-25 LAB — CBC
HCT: 43.7 % (ref 36.0–46.0)
HEMOGLOBIN: 14.3 g/dL (ref 12.0–15.0)
MCH: 31.2 pg (ref 26.0–34.0)
MCHC: 32.7 g/dL (ref 30.0–36.0)
MCV: 95.4 fL (ref 78.0–100.0)
Platelets: 325 10*3/uL (ref 150–400)
RBC: 4.58 MIL/uL (ref 3.87–5.11)
RDW: 13 % (ref 11.5–15.5)
WBC: 8 10*3/uL (ref 4.0–10.5)

## 2017-10-01 DIAGNOSIS — G8929 Other chronic pain: Secondary | ICD-10-CM | POA: Diagnosis not present

## 2017-10-01 DIAGNOSIS — M25561 Pain in right knee: Secondary | ICD-10-CM | POA: Diagnosis not present

## 2017-10-01 DIAGNOSIS — G90521 Complex regional pain syndrome I of right lower limb: Secondary | ICD-10-CM | POA: Diagnosis not present

## 2017-10-01 DIAGNOSIS — Z5181 Encounter for therapeutic drug level monitoring: Secondary | ICD-10-CM | POA: Diagnosis not present

## 2017-10-01 DIAGNOSIS — G894 Chronic pain syndrome: Secondary | ICD-10-CM | POA: Diagnosis not present

## 2017-10-01 DIAGNOSIS — Z79899 Other long term (current) drug therapy: Secondary | ICD-10-CM | POA: Diagnosis not present

## 2017-10-02 NOTE — H&P (Signed)
Urology Preoperative H&P   Chief Complaint: Left renal mass  History of Present Illness: Tonya Cain is a 67 y.o. female with female referred by Dr. Gloriann Loan to discuss surgical options with regards to her solid and enhancing left renal mass. The mass was initially discovered during a work-up for on-going periumbilical pain following a spinal neural stimulator removal. 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 Her f/u CT wo/w contrast confirmed a 6 cm left upper pole solid and enhancing renal mass with features concerning for RCC with no evidence of metastasis or renal vein involvement. Her recent CXR was WNL.   Today, she denies flank pain, dysuria or hematuria. She has no personal/family history of GU malignancies. She admits to smoking 1-2 cigarettes per day in her 33s, but has not smoked in many years.    CT ABD/PEL wo/w 08/26/17  IMPRESSION:  No significant change in 6 cm hypervascular mass in upper pole of  left kidney, consistent with renal cell carcinoma.   No evidence of metastatic disease within the abdomen or pelvis.   Mild diffuse bladder wall thickening, suspicious for cystitis.  Consider cystoscopy for further evaluation.     Past Medical History:  Diagnosis Date  . Anxiety   . Arthritis   . Chronic kidney disease    mass   . RSD (reflex sympathetic dystrophy)     Past Surgical History:  Procedure Laterality Date  . CESAREAN SECTION    . DEBRIDEMENT TENNIS ELBOW    . EYE SURGERY     bilateral  . KNEE SURGERY     right knee 1999, 2002, 2005, 2009  arthroscopic, infection, replacment of joint  . PARTIAL NEPHRECTOMY     Dr. Winter 10-03-17  . rotator cuff surgery     left    Allergies: No Known Allergies  No family history on file.  Social History:  reports that she has been smoking. She has smoked for the past 1.00 year. She has never used smokeless tobacco. She reports that she drinks about 1.0 standard drinks  of alcohol per week. She reports that she does not use drugs.  ROS: A complete review of systems was performed.  All systems are negative except for pertinent findings as noted.  Physical Exam:  Vital signs in last 24 hours:   Constitutional:  Alert and oriented, No acute distress Cardiovascular: Regular rate and rhythm, No JVD Respiratory: Normal respiratory effort, Lungs clear bilaterally GI: Abdomen is soft, nontender, nondistended, no abdominal masses GU: No CVA tenderness Lymphatic: No lymphadenopathy Neurologic: Grossly intact, no focal deficits Psychiatric: Normal mood and affect  Laboratory Data:  No results for input(s): WBC, HGB, HCT, PLT in the last 72 hours.  No results for input(s): NA, K, CL, GLUCOSE, BUN, CALCIUM, CREATININE in the last 72 hours.  Invalid input(s): CO3   No results found for this or any previous visit (from the past 24 hour(s)). No results found for this or any previous visit (from the past 240 hour(s)).  Renal Function: No results for input(s): CREATININE in the last 168 hours. CrCl cannot be calculated (Patient's most recent lab result is older than the maximum 21 days allowed.).  Radiologic Imaging: No results found.  I independently reviewed the above imaging studies.  Assessment and Plan Tonya Cain is a 67 y.o. female with a 6 cm left upper renal mass with features concerning for malignancy Hx of Reflex Sympathetic Dystrophy-- on Butrans patch   -I reviewed  imaging results and films with the patient personally. Discussed that the mass in question has features concerning for malignancy. Explained the natural history of presumed renal cell carcinoma. Reviewed the AUA guidelines for evaluation and treatment of the renal mass. Active surveillance, in situ tumor ablation, partial and radical nephrectomy discussed. The risks of robotic partial nephrectomy were discussed in detail including but not limited to: negative pathology, open  conversion, completion nephrectomy, infection of the urinary tract/skin/abdominal cavity, VTE, MI/CVA, lymphatic leak, injury to adjacent solid/hollow viscus organs, bleeding requiring a blood transfusion, catastrophic bleeding, hernia formation, need for postoperative angioembolization, urinary leak requiring stent/drain, and other imponderables.   She voices understanding and wishes to proceed with robotic assisted LEFT partial nephrectomy.    Ellison Hughs, MD 10/02/2017, 10:31 PM  [facility] Pager: [phone]

## 2017-10-03 ENCOUNTER — Encounter (HOSPITAL_COMMUNITY): Payer: Self-pay | Admitting: Certified Registered"

## 2017-10-03 ENCOUNTER — Inpatient Hospital Stay (HOSPITAL_COMMUNITY)
Admission: RE | Admit: 2017-10-03 | Discharge: 2017-10-05 | DRG: 657 | Disposition: A | Discharge status: Home / Self Care | Payer: Medicare Other | Source: Ambulatory Visit | Attending: Urology | Admitting: Urology

## 2017-10-03 ENCOUNTER — Encounter (HOSPITAL_COMMUNITY)
Admission: RE | Disposition: A | Discharge status: Home / Self Care | Payer: Self-pay | Source: Ambulatory Visit | Attending: Urology

## 2017-10-03 ENCOUNTER — Other Ambulatory Visit: Payer: Self-pay

## 2017-10-03 ENCOUNTER — Ambulatory Visit (HOSPITAL_COMMUNITY): Payer: Medicare Other | Admitting: Anesthesiology

## 2017-10-03 DIAGNOSIS — Z96651 Presence of right artificial knee joint: Secondary | ICD-10-CM | POA: Diagnosis not present

## 2017-10-03 DIAGNOSIS — N2889 Other specified disorders of kidney and ureter: Secondary | ICD-10-CM | POA: Diagnosis present

## 2017-10-03 DIAGNOSIS — F329 Major depressive disorder, single episode, unspecified: Secondary | ICD-10-CM | POA: Diagnosis not present

## 2017-10-03 DIAGNOSIS — C642 Malignant neoplasm of left kidney, except renal pelvis: Secondary | ICD-10-CM | POA: Diagnosis not present

## 2017-10-03 DIAGNOSIS — Z79899 Other long term (current) drug therapy: Secondary | ICD-10-CM | POA: Diagnosis not present

## 2017-10-03 DIAGNOSIS — G9050 Complex regional pain syndrome I, unspecified: Secondary | ICD-10-CM | POA: Diagnosis present

## 2017-10-03 DIAGNOSIS — D49512 Neoplasm of unspecified behavior of left kidney: Secondary | ICD-10-CM | POA: Diagnosis not present

## 2017-10-03 DIAGNOSIS — Z87891 Personal history of nicotine dependence: Secondary | ICD-10-CM

## 2017-10-03 HISTORY — PX: ROBOTIC ASSITED PARTIAL NEPHRECTOMY: SHX6087

## 2017-10-03 HISTORY — PX: OPERATIVE ULTRASOUND: SHX5996

## 2017-10-03 LAB — POCT I-STAT 4, (NA,K, GLUC, HGB,HCT)
Glucose, Bld: 101 mg/dL — ABNORMAL HIGH (ref 70–99)
HCT: 43 % (ref 36.0–46.0)
Hemoglobin: 14.6 g/dL (ref 12.0–15.0)
POTASSIUM: 4 mmol/L (ref 3.5–5.1)
SODIUM: 139 mmol/L (ref 135–145)

## 2017-10-03 LAB — HEMOGLOBIN AND HEMATOCRIT, BLOOD
HCT: 38.6 % (ref 36.0–46.0)
Hemoglobin: 12.9 g/dL (ref 12.0–15.0)

## 2017-10-03 LAB — TYPE AND SCREEN
ABO/RH(D): O POS
Antibody Screen: NEGATIVE

## 2017-10-03 SURGERY — NEPHRECTOMY, PARTIAL, ROBOT-ASSISTED
Anesthesia: General | Laterality: Left

## 2017-10-03 MED ORDER — LABETALOL HCL 5 MG/ML IV SOLN
5.0000 mg | INTRAVENOUS | Status: DC | PRN
  Administered 2017-10-03 (×2): 5 mg via INTRAVENOUS

## 2017-10-03 MED ORDER — BSS IO SOLN
15.0000 mL | Freq: Once | INTRAOCULAR | Status: AC
  Administered 2017-10-03: 15 mL
  Filled 2017-10-03: qty 15

## 2017-10-03 MED ORDER — DIPHENHYDRAMINE HCL 12.5 MG/5ML PO ELIX: 12.5000 mg | ORAL_SOLUTION | Freq: Four times a day (QID) | ORAL | Status: DC | PRN

## 2017-10-03 MED ORDER — SUGAMMADEX SODIUM 200 MG/2ML IV SOLN
INTRAVENOUS | Status: AC
  Filled 2017-10-03: qty 2

## 2017-10-03 MED ORDER — PROPOFOL 10 MG/ML IV BOLUS
INTRAVENOUS | Status: AC
  Filled 2017-10-03: qty 20

## 2017-10-03 MED ORDER — ROCURONIUM BROMIDE 10 MG/ML (PF) SYRINGE
PREFILLED_SYRINGE | INTRAVENOUS | Status: AC
  Filled 2017-10-03: qty 20

## 2017-10-03 MED ORDER — CEFAZOLIN SODIUM-DEXTROSE 2-4 GM/100ML-% IV SOLN
2.0000 g | Freq: Once | INTRAVENOUS | Status: AC
  Administered 2017-10-03 (×2): 2 g via INTRAVENOUS
  Filled 2017-10-03: qty 100

## 2017-10-03 MED ORDER — CEFAZOLIN SODIUM-DEXTROSE 2-4 GM/100ML-% IV SOLN
INTRAVENOUS | Status: AC
  Filled 2017-10-03: qty 100

## 2017-10-03 MED ORDER — THROMBIN (RECOMBINANT) 5000 UNITS EX SOLR
CUTANEOUS | Status: AC
  Filled 2017-10-03: qty 5000

## 2017-10-03 MED ORDER — DEXAMETHASONE SODIUM PHOSPHATE 10 MG/ML IJ SOLN
INTRAMUSCULAR | Status: AC
  Filled 2017-10-03: qty 1

## 2017-10-03 MED ORDER — FENTANYL CITRATE (PF) 250 MCG/5ML IJ SOLN
INTRAMUSCULAR | Status: AC
  Filled 2017-10-03: qty 5

## 2017-10-03 MED ORDER — KETAMINE HCL 10 MG/ML IJ SOLN
INTRAMUSCULAR | Status: AC
  Filled 2017-10-03: qty 1

## 2017-10-03 MED ORDER — LIDOCAINE 2% (20 MG/ML) 5 ML SYRINGE
INTRAMUSCULAR | Status: DC | PRN
  Administered 2017-10-03: 60 mg via INTRAVENOUS

## 2017-10-03 MED ORDER — DEXTROSE-NACL 5-0.45 % IV SOLN
INTRAVENOUS | Status: DC
  Administered 2017-10-03: 20:00:00 via INTRAVENOUS

## 2017-10-03 MED ORDER — EPHEDRINE 5 MG/ML INJ
INTRAVENOUS | Status: AC
  Filled 2017-10-03: qty 10

## 2017-10-03 MED ORDER — LIDOCAINE 2% (20 MG/ML) 5 ML SYRINGE
INTRAMUSCULAR | Status: AC
  Filled 2017-10-03: qty 5

## 2017-10-03 MED ORDER — FENTANYL CITRATE (PF) 100 MCG/2ML IJ SOLN
INTRAMUSCULAR | Status: AC
  Filled 2017-10-03: qty 2

## 2017-10-03 MED ORDER — PROPOFOL 10 MG/ML IV BOLUS
INTRAVENOUS | Status: DC | PRN
  Administered 2017-10-03: 150 mg via INTRAVENOUS

## 2017-10-03 MED ORDER — KETAMINE HCL 10 MG/ML IJ SOLN
INTRAMUSCULAR | Status: DC | PRN
  Administered 2017-10-03: 10 mg via INTRAVENOUS
  Administered 2017-10-03: 30 mg via INTRAVENOUS

## 2017-10-03 MED ORDER — HYDROMORPHONE HCL 1 MG/ML IJ SOLN
INTRAMUSCULAR | Status: AC
  Filled 2017-10-03: qty 1

## 2017-10-03 MED ORDER — FENTANYL CITRATE (PF) 250 MCG/5ML IJ SOLN
INTRAMUSCULAR | Status: AC
Start: 2017-10-03 — End: ?
  Filled 2017-10-03: qty 5

## 2017-10-03 MED ORDER — ONDANSETRON HCL 4 MG PO TABS: 4.0000 mg | ORAL_TABLET | Freq: Every day | ORAL | 0 refills | Status: AC | PRN

## 2017-10-03 MED ORDER — LACTATED RINGERS IV SOLN
INTRAVENOUS | Status: DC
  Administered 2017-10-03 (×4): via INTRAVENOUS

## 2017-10-03 MED ORDER — KETOROLAC TROMETHAMINE 0.5 % OP SOLN
1.0000 [drp] | Freq: Three times a day (TID) | OPHTHALMIC | Status: AC | PRN
  Administered 2017-10-03: 1 [drp] via OPHTHALMIC
  Filled 2017-10-03: qty 5

## 2017-10-03 MED ORDER — ONDANSETRON HCL 4 MG/2ML IJ SOLN
INTRAMUSCULAR | Status: AC
  Filled 2017-10-03: qty 2

## 2017-10-03 MED ORDER — BUPIVACAINE LIPOSOME 1.3 % IJ SUSP
20.0000 mL | Freq: Once | INTRAMUSCULAR | Status: AC
  Administered 2017-10-03: 20 mL
  Filled 2017-10-03: qty 20

## 2017-10-03 MED ORDER — ONDANSETRON HCL 4 MG/2ML IJ SOLN
INTRAMUSCULAR | Status: DC | PRN
  Administered 2017-10-03: 4 mg via INTRAVENOUS

## 2017-10-03 MED ORDER — OXYCODONE HCL 5 MG PO TABS
5.0000 mg | ORAL_TABLET | ORAL | Status: DC | PRN
  Administered 2017-10-04 – 2017-10-05 (×3): 5 mg via ORAL
  Filled 2017-10-03 (×3): qty 1

## 2017-10-03 MED ORDER — ACETAMINOPHEN 10 MG/ML IV SOLN
INTRAVENOUS | Status: AC
  Filled 2017-10-03: qty 100

## 2017-10-03 MED ORDER — HEMOSTATIC AGENTS (NO CHARGE) OPTIME
TOPICAL | Status: DC | PRN
  Administered 2017-10-03: 1 via TOPICAL

## 2017-10-03 MED ORDER — DIPHENHYDRAMINE HCL 50 MG/ML IJ SOLN: 12.5000 mg | Freq: Four times a day (QID) | INTRAMUSCULAR | Status: DC | PRN

## 2017-10-03 MED ORDER — LABETALOL HCL 5 MG/ML IV SOLN
INTRAVENOUS | Status: AC
  Filled 2017-10-03: qty 4

## 2017-10-03 MED ORDER — FENTANYL CITRATE (PF) 100 MCG/2ML IJ SOLN
INTRAMUSCULAR | Status: DC | PRN
  Administered 2017-10-03 (×3): 100 ug via INTRAVENOUS
  Administered 2017-10-03: 150 ug via INTRAVENOUS
  Administered 2017-10-03: 100 ug via INTRAVENOUS

## 2017-10-03 MED ORDER — ATROPINE SULFATE 1 MG/10ML IJ SOSY
PREFILLED_SYRINGE | INTRAMUSCULAR | Status: AC
  Filled 2017-10-03: qty 10

## 2017-10-03 MED ORDER — ACETAMINOPHEN 10 MG/ML IV SOLN
INTRAVENOUS | Status: DC | PRN
  Administered 2017-10-03: 1000 mg via INTRAVENOUS

## 2017-10-03 MED ORDER — ONDANSETRON HCL 4 MG/2ML IJ SOLN
4.0000 mg | INTRAMUSCULAR | Status: DC | PRN
  Administered 2017-10-04 – 2017-10-05 (×2): 4 mg via INTRAVENOUS
  Filled 2017-10-03 (×2): qty 2

## 2017-10-03 MED ORDER — LACTATED RINGERS IR SOLN
Status: DC | PRN
  Administered 2017-10-03: 1000 mL

## 2017-10-03 MED ORDER — DOCUSATE SODIUM 100 MG PO CAPS: 100.0000 mg | ORAL_CAPSULE | Freq: Two times a day (BID) | ORAL | Status: AC

## 2017-10-03 MED ORDER — SUGAMMADEX SODIUM 200 MG/2ML IV SOLN
INTRAVENOUS | Status: DC | PRN
  Administered 2017-10-03: 150 mg via INTRAVENOUS

## 2017-10-03 MED ORDER — ROCURONIUM BROMIDE 10 MG/ML (PF) SYRINGE
PREFILLED_SYRINGE | INTRAVENOUS | Status: AC
  Filled 2017-10-03: qty 40

## 2017-10-03 MED ORDER — MIDAZOLAM HCL 2 MG/2ML IJ SOLN
INTRAMUSCULAR | Status: AC
  Filled 2017-10-03: qty 2

## 2017-10-03 MED ORDER — FENTANYL CITRATE (PF) 250 MCG/5ML IJ SOLN
INTRAMUSCULAR | Status: DC | PRN
  Administered 2017-10-03 (×2): 100 ug via INTRAVENOUS

## 2017-10-03 MED ORDER — LIDOCAINE 2% (20 MG/ML) 5 ML SYRINGE
INTRAMUSCULAR | Status: AC
  Filled 2017-10-03: qty 10

## 2017-10-03 MED ORDER — ROCURONIUM BROMIDE 10 MG/ML (PF) SYRINGE
PREFILLED_SYRINGE | INTRAVENOUS | Status: AC
  Filled 2017-10-03: qty 10

## 2017-10-03 MED ORDER — HYDROMORPHONE HCL 1 MG/ML IJ SOLN
0.2500 mg | INTRAMUSCULAR | Status: DC | PRN
  Administered 2017-10-03 (×2): 0.5 mg via INTRAVENOUS

## 2017-10-03 MED ORDER — SUCCINYLCHOLINE CHLORIDE 200 MG/10ML IV SOSY
PREFILLED_SYRINGE | INTRAVENOUS | Status: AC
  Filled 2017-10-03: qty 10

## 2017-10-03 MED ORDER — POLYMYXIN B-TRIMETHOPRIM 10000-0.1 UNIT/ML-% OP SOLN
1.0000 [drp] | Freq: Three times a day (TID) | OPHTHALMIC | Status: AC
  Administered 2017-10-03 – 2017-10-04 (×3): 1 [drp] via OPHTHALMIC
  Filled 2017-10-03: qty 10

## 2017-10-03 MED ORDER — HYDROCODONE-ACETAMINOPHEN 5-325 MG PO TABS: 1.0000 | ORAL_TABLET | Freq: Four times a day (QID) | ORAL | 0 refills | Status: DC | PRN

## 2017-10-03 MED ORDER — SUGAMMADEX SODIUM 500 MG/5ML IV SOLN
INTRAVENOUS | Status: AC
  Filled 2017-10-03: qty 10

## 2017-10-03 MED ORDER — CEFAZOLIN SODIUM-DEXTROSE 1-4 GM/50ML-% IV SOLN
1.0000 g | Freq: Three times a day (TID) | INTRAVENOUS | Status: AC
  Administered 2017-10-04 (×2): 1 g via INTRAVENOUS
  Filled 2017-10-03 (×2): qty 50

## 2017-10-03 MED ORDER — PHENYLEPHRINE 40 MCG/ML (10ML) SYRINGE FOR IV PUSH (FOR BLOOD PRESSURE SUPPORT)
PREFILLED_SYRINGE | INTRAVENOUS | Status: AC
  Filled 2017-10-03: qty 10

## 2017-10-03 MED ORDER — DEXAMETHASONE SODIUM PHOSPHATE 10 MG/ML IJ SOLN
INTRAMUSCULAR | Status: DC | PRN
  Administered 2017-10-03: 10 mg via INTRAVENOUS

## 2017-10-03 MED ORDER — HYDROMORPHONE HCL 1 MG/ML IJ SOLN
0.5000 mg | INTRAMUSCULAR | Status: DC | PRN
  Administered 2017-10-04 (×2): 0.5 mg via INTRAVENOUS
  Administered 2017-10-04 (×2): 1 mg via INTRAVENOUS
  Administered 2017-10-04: 0.5 mg via INTRAVENOUS
  Administered 2017-10-04 – 2017-10-05 (×2): 1 mg via INTRAVENOUS
  Filled 2017-10-03 (×7): qty 1

## 2017-10-03 MED ORDER — MIDAZOLAM HCL 5 MG/5ML IJ SOLN
INTRAMUSCULAR | Status: DC | PRN
  Administered 2017-10-03: 2 mg via INTRAVENOUS

## 2017-10-03 MED ORDER — ACETAMINOPHEN 10 MG/ML IV SOLN
1000.0000 mg | Freq: Four times a day (QID) | INTRAVENOUS | Status: AC
  Administered 2017-10-03 – 2017-10-04 (×3): 1000 mg via INTRAVENOUS
  Filled 2017-10-03 (×4): qty 100

## 2017-10-03 MED ORDER — ACETAMINOPHEN 325 MG PO TABS: 650.0000 mg | ORAL_TABLET | ORAL | Status: DC | PRN

## 2017-10-03 MED ORDER — ROCURONIUM BROMIDE 10 MG/ML (PF) SYRINGE
PREFILLED_SYRINGE | INTRAVENOUS | Status: DC | PRN
  Administered 2017-10-03: 10 mg via INTRAVENOUS
  Administered 2017-10-03 (×2): 20 mg via INTRAVENOUS
  Administered 2017-10-03: 5 mg via INTRAVENOUS
  Administered 2017-10-03: 50 mg via INTRAVENOUS
  Administered 2017-10-03: 20 mg via INTRAVENOUS

## 2017-10-03 MED ORDER — BUPIVACAINE-EPINEPHRINE (PF) 0.25% -1:200000 IJ SOLN
INTRAMUSCULAR | Status: AC
  Filled 2017-10-03: qty 30

## 2017-10-03 MED ORDER — ALBUMIN HUMAN 5 % IV SOLN
INTRAVENOUS | Status: DC | PRN
  Administered 2017-10-03: 17:00:00 via INTRAVENOUS

## 2017-10-03 MED ORDER — EVICEL 5 ML EX KIT
PACK | Freq: Once | CUTANEOUS | Status: DC
  Filled 2017-10-03: qty 1

## 2017-10-03 MED ORDER — BUPIVACAINE-EPINEPHRINE 0.25% -1:200000 IJ SOLN
INTRAMUSCULAR | Status: DC | PRN
  Administered 2017-10-03: 13 mL

## 2017-10-03 SURGICAL SUPPLY — 73 items
APPLICATOR SURGIFLO ENDO (HEMOSTASIS) IMPLANT
BAG LAPAROSCOPIC 12 15 PORT 16 (BASKET) ×1 IMPLANT
BAG RETRIEVAL 12/15 (BASKET) ×2
BAG RETRIEVAL 12/15MM (BASKET) ×1
CATH ROBINSON RED A/P 8FR (CATHETERS) ×3 IMPLANT
CHLORAPREP W/TINT 26ML (MISCELLANEOUS) ×3 IMPLANT
CLIP SUT LAPRA TY ABSORB (SUTURE) ×3 IMPLANT
CLIP VESOCCLUDE MED LG 6/CT (CLIP) IMPLANT
CLIP VESOLOCK LG 6/CT PURPLE (CLIP) ×6 IMPLANT
CLIP VESOLOCK MED LG 6/CT (CLIP) ×9 IMPLANT
CLIP VESOLOCK XL 6/CT (CLIP) ×3 IMPLANT
COVER SURGICAL LIGHT HANDLE (MISCELLANEOUS) ×3 IMPLANT
COVER TIP SHEARS 8 DVNC (MISCELLANEOUS) ×1 IMPLANT
COVER TIP SHEARS 8MM DA VINCI (MISCELLANEOUS) ×2
CUTTER ECHEON FLEX ENDO 45 340 (ENDOMECHANICALS) ×3 IMPLANT
DECANTER SPIKE VIAL GLASS SM (MISCELLANEOUS) ×3 IMPLANT
DERMABOND ADVANCED (GAUZE/BANDAGES/DRESSINGS) ×2
DERMABOND ADVANCED .7 DNX12 (GAUZE/BANDAGES/DRESSINGS) ×1 IMPLANT
DRAIN CHANNEL 15F RND FF 3/16 (WOUND CARE) IMPLANT
DRAPE ARM DVNC X/XI (DISPOSABLE) ×4 IMPLANT
DRAPE COLUMN DVNC XI (DISPOSABLE) ×1 IMPLANT
DRAPE DA VINCI XI ARM (DISPOSABLE) ×8
DRAPE DA VINCI XI COLUMN (DISPOSABLE) ×2
DRAPE INCISE IOBAN 66X45 STRL (DRAPES) ×3 IMPLANT
DRAPE SHEET LG 3/4 BI-LAMINATE (DRAPES) ×3 IMPLANT
ELECT PENCIL ROCKER SW 15FT (MISCELLANEOUS) ×3 IMPLANT
ELECT REM PT RETURN 15FT ADLT (MISCELLANEOUS) ×3 IMPLANT
EVACUATOR SILICONE 100CC (DRAIN) IMPLANT
GLOVE BIO SURGEON STRL SZ 6.5 (GLOVE) ×2 IMPLANT
GLOVE BIO SURGEONS STRL SZ 6.5 (GLOVE) ×1
GLOVE BIOGEL M STRL SZ7.5 (GLOVE) ×6 IMPLANT
GOWN STRL REUS W/TWL LRG LVL3 (GOWN DISPOSABLE) ×3 IMPLANT
GOWN STRL REUS W/TWL XL LVL3 (GOWN DISPOSABLE) ×6 IMPLANT
HEMOSTAT SURGICEL 4X8 (HEMOSTASIS) ×3 IMPLANT
HOLDER FOLEY CATH W/STRAP (MISCELLANEOUS) ×3 IMPLANT
IRRIG SUCT STRYKERFLOW 2 WTIP (MISCELLANEOUS) ×3
IRRIGATION SUCT STRKRFLW 2 WTP (MISCELLANEOUS) ×1 IMPLANT
KIT BASIN OR (CUSTOM PROCEDURE TRAY) ×3 IMPLANT
MARKER SKIN DUAL TIP RULER LAB (MISCELLANEOUS) ×3 IMPLANT
NEEDLE INSUFFLATION 120MM (ENDOMECHANICALS) ×3 IMPLANT
NEEDLE INSUFFLATION 14GA 120MM (NEEDLE) ×3 IMPLANT
PORT ACCESS TROCAR AIRSEAL 12 (TROCAR) ×1 IMPLANT
PORT ACCESS TROCAR AIRSEAL 5M (TROCAR) ×2
POSITIONER SURGICAL ARM (MISCELLANEOUS) ×6 IMPLANT
POUCH SPECIMEN RETRIEVAL 10MM (ENDOMECHANICALS) IMPLANT
RELOAD STAPLER WHITE 60MM (STAPLE) IMPLANT
SEAL CANN UNIV 5-8 DVNC XI (MISCELLANEOUS) ×3 IMPLANT
SEAL XI 5MM-8MM UNIVERSAL (MISCELLANEOUS) ×6
SET TRI-LUMEN FLTR TB AIRSEAL (TUBING) ×3 IMPLANT
SOLUTION ELECTROLUBE (MISCELLANEOUS) ×3 IMPLANT
STAPLE ECHEON FLEX 60 POW ENDO (STAPLE) IMPLANT
STAPLE RELOAD 45 WHT (STAPLE) ×2 IMPLANT
STAPLE RELOAD 45MM WHITE (STAPLE) ×4
STAPLER RELOAD WHITE 60MM (STAPLE)
SURGIFLO W/THROMBIN 8M KIT (HEMOSTASIS) IMPLANT
SUT ETHILON 2 0 PSLX (SUTURE) IMPLANT
SUT MNCRL AB 4-0 PS2 18 (SUTURE) ×6 IMPLANT
SUT PDS AB 0 CT1 36 (SUTURE) ×6 IMPLANT
SUT V-LOC BARB 180 2/0GR9 GS23 (SUTURE)
SUT VIC AB 1 CT1 27 (SUTURE) ×18
SUT VIC AB 1 CT1 27XBRD ANTBC (SUTURE) ×9 IMPLANT
SUT VICRYL 0 UR6 27IN ABS (SUTURE) ×3 IMPLANT
SUT VLOC BARB 180 ABS3/0GR12 (SUTURE) ×6
SUTURE V-LC BRB 180 2/0GR9GS23 (SUTURE) IMPLANT
SUTURE VLOC BRB 180 ABS3/0GR12 (SUTURE) ×2 IMPLANT
TIP RIGID 35CM EVICEL (HEMOSTASIS) IMPLANT
TOWEL OR 17X26 10 PK STRL BLUE (TOWEL DISPOSABLE) ×3 IMPLANT
TOWEL OR NON WOVEN STRL DISP B (DISPOSABLE) ×3 IMPLANT
TRAY FOLEY CATH 14FR (SET/KITS/TRAYS/PACK) ×3 IMPLANT
TRAY FOLEY MTR SLVR 16FR STAT (SET/KITS/TRAYS/PACK) IMPLANT
TRAY LAPAROSCOPIC (CUSTOM PROCEDURE TRAY) ×3 IMPLANT
TROCAR BLADELESS OPT 5 100 (ENDOMECHANICALS) IMPLANT
WATER STERILE IRR 1000ML POUR (IV SOLUTION) ×3 IMPLANT

## 2017-10-03 NOTE — Anesthesia Procedure Notes (Signed)
Procedure Name: Intubation Performed by: Gean Maidens, CRNA Pre-anesthesia Checklist: Patient identified, Emergency Drugs available, Suction available, Patient being monitored and Timeout performed Patient Re-evaluated:Patient Re-evaluated prior to induction Oxygen Delivery Method: Circle system utilized Preoxygenation: Pre-oxygenation with 100% oxygen Induction Type: IV induction Ventilation: Mask ventilation without difficulty Laryngoscope Size: Mac and 3 Grade View: Grade II Tube type: Oral Tube size: 7.0 mm Number of attempts: 1 Airway Equipment and Method: Stylet Placement Confirmation: ETT inserted through vocal cords under direct vision,  positive ETCO2 and breath sounds checked- equal and bilateral Secured at: 21 cm Tube secured with: Tape Dental Injury: Teeth and Oropharynx as per pre-operative assessment

## 2017-10-03 NOTE — Op Note (Signed)
Operative Note  Preoperative diagnosis:  1.  6 cm left upper pole renal mass  Postoperative diagnosis: 1.  6 cm left upper pole renal mass  Procedure(s): 1.  Robotic assisted laparoscopic left radical nephrectomy 2.  Intraoperative ultrasound of single retroperitoneal organ  Surgeon: Ellison Hughs, MD  Assistants:   1.  Debbrah Alar, [facility]  An assistant was required for this surgical procedure.  The duties of the assistant included but were not limited to suctioning, passing suture, camera manipulation, retraction.  This procedure would not be able to be performed without an Environmental consultant.  2.  Case Clydene Laming, MD PGY-4   Anesthesia:  General   Complications:  None   EBL:  25 mL  Specimens: 1. Left renal mass  Drains/Catheters: 1.  Foley catheter  Intraoperative findings:   1. 6 cm left upper renal mass that was invested into the left renal hilar vessels  2.   The tail of the pancreas was densely adherent to the upper pole of the left renal mass  Indication:  SHAKERIA ROBINETTE is a 67 y.o. female with referred by Dr. Gloriann Loan to discuss surgical options with regards to her solid and enhancing left renal mass. The mass was initially discovered during a work-up for on-going periumbilical pain following a spinal neural stimulator removal. 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 Her f/u CT wo/w contrast confirmed a 6 cm left upper pole solid and enhancing renal mass with features concerning for RCC with no evidence of metastasis or renal vein involvement. Her recent CXR was WNL.   The patient has been consented for the above procedures, voices understanding and wishes to proceed.    Description of procedure:  After informed consent was obtained, the patient was brought to the operating room and general endotracheal anesthesia was administered.  The patient was placed in the right lateral decubitus position and prepped and draped in usual  sterile fashion.  A timeout was performed.  An 8 mm incision was then made lateral to the left rectus muscle at the level of the left 12th rib.  Abdominal access was obtained via a Veress needle.  The abdominal cavity was then insufflated up to 15 mmHg.  An 8 mm port was then introduced into the abdominal cavity.  Inspection of the port entry site by the robotic camera revealed no adjacent organ injury.  We then placed 2 additional 8 mm robotic ports to triangulate the left renal hilum.  A 12 mm assistant port was then placed between the lower robotic port and the camera, in the midline.  The white line of Toldt along the descending colon was incised sharply and the colon, along with its mesocolonic fat, was reflected medially until the aorta was identified.  We then made a small window adjacent to the lower pole of the left kidney, identifying the left psoas muscle, left ureter and left gonadal vein.  The left ureter and gonadal vein were then reflected anteriorly allowing [place] to then incised the perihilar attachments using electrocautery.  We encountered a lumbar vein adjacent to the insertion of the left gonadal vein into the left renal vein.  This lumbar vein was ligated with hemo-lock clips in 2 places and incised sharply.  This provided [place] excellent exposure to the left renal hilum.  The perilymphatic tissue surrounding the left renal artery was carefully dissected away, providing [place] excellent exposure.  The anterior portion of Gerota's fascia was incised, allowing reflection the perinephric fat  medially and laterally until there was adequate exposure of the left renal mass.  What I suspect was the tail of the pancreas was densely adherent to the left upper pole renal mass and carefully dissected away without undue trauma.  Intraoperative ultrasound confirmed the heterogenous echogenicity of the lesion compared to the remainder of the renal parenchyma and allowed identification of the depth/borders of the  mass, which were demarcated using electrocautery along the renal capsule.  The mass had two prominent arteries feeding it, which would have made partial nephrectomy not only risky from a bleeding standpoint, but also from a cancer control aspect.  At that point, after careful consideration, the decision was made to proceed with a left radical nephrectomy.   The left renal artery was then exposed and ligated with hemo-lock clips, showing immediate devascularization of the left kidney.  The left renal vein was then ligated with a 45 mm endovascular stapler.  Both the left renal artery and vein showed excellent hemostasis following ligation.  The gonadal artery was seen emanating adjacent to the left renal artery and cannot be salvaged due to investing into the periureteral tissue.  The left lateral artery was ligated with Humalog clips and incised sharply.  The left gonadal vein and left ureter were ligated in a similar fashion.  The remaining perinephric tissue was incised using electrocautery until the left kidney was completely free.  The left upper quadrant was inspected and was hemostatic.  A medium sized piece of Surgicel was then placed in the left upper quadrant for further hemostasis.  A left lower quadrant Gibson incision was then made and the left kidney, still within the Endo Catch bag, was then removed from the abdominal cavity.  The Gibson incision was then closed using running 0 Vicryl to re-approximate the internal oblique fascia and 0 PDS suture to re-approximate the external oblique layers. The abdomen with the inflated and a second inspection of the left retroperitoneum revealed complete hemostasis.  The abdomen was then desufflated and all laparoscopic ports were removed.  A 4-0 monocryl was then used to close all skin incisions, which were then dressed with dermabond. The patient was then replaced in the supine position and awoken from anesthesia. The patient tolerated the procedure well and  was transferred to the postanesthesia unit in stable condition.  Plan:  The patient will be monitored on the floor overnight.  Foley out in the AM.  Clear liquid diet on POD 0 with plans to advance as tolerated.  Will recheck labs in the AM.

## 2017-10-03 NOTE — Transfer of Care (Signed)
Immediate Anesthesia Transfer of Care Note  Patient: Tonya Cain  Procedure(s) Performed: XI ROBOTIC ASSITED RADICAL NEPHRECTOMY (Left ) OPERATIVE ULTRASOUND (Left )  Patient Location: PACU  Anesthesia Type:General  Level of Consciousness: sedated  Airway & Oxygen Therapy: Patient Spontanous Breathing and Patient connected to face mask oxygen  Post-op Assessment: Report given to RN and Post -op Vital signs reviewed and stable  Post vital signs: Reviewed and stable  Last Vitals:  Vitals Value Taken Time  BP 144/87 10/03/2017  5:05 PM  Temp    Pulse 76 10/03/2017  5:07 PM  Resp 15 10/03/2017  5:07 PM  SpO2 100 % 10/03/2017  5:07 PM  Vitals shown include unvalidated device data.  Last Pain:  Vitals:   10/03/17 1053  TempSrc: Oral  PainSc: 0-No pain         Complications: No apparent anesthesia complications

## 2017-10-03 NOTE — Anesthesia Procedure Notes (Signed)
Date/Time: 10/03/2017 4:58 PM Performed by: Cynda Familia, CRNA Oxygen Delivery Method: Simple face mask Placement Confirmation: positive ETCO2 and breath sounds checked- equal and bilateral Dental Injury: Teeth and Oropharynx as per pre-operative assessment

## 2017-10-03 NOTE — Anesthesia Preprocedure Evaluation (Addendum)
Anesthesia Evaluation  Patient identified by MRN, date of birth, ID band Patient awake    Reviewed: Allergy & Precautions, H&P , NPO status , Patient's Chart, lab work & pertinent test results  Airway Mallampati: I  TM Distance: >3 FB Neck ROM: Full    Dental no notable dental hx.    Pulmonary Current Smoker,    Pulmonary exam normal        Cardiovascular negative cardio ROS Normal cardiovascular exam     Neuro/Psych Anxiety Depression negative neurological ROS     GI/Hepatic negative GI ROS, Neg liver ROS,   Endo/Other  negative endocrine ROS  Renal/GU Renal disease  negative genitourinary   Musculoskeletal  (+) Arthritis , Osteoarthritis,    Abdominal   Peds  Hematology negative hematology ROS (+)   Anesthesia Other Findings   Reproductive/Obstetrics negative OB ROS                            Anesthesia Physical Anesthesia Plan  ASA: II  Anesthesia Plan: General   Post-op Pain Management:    Induction: Intravenous  PONV Risk Score and Plan: 3 and Ondansetron, Dexamethasone, Midazolam and Treatment may vary due to age or medical condition  Airway Management Planned: Oral ETT  Additional Equipment:   Intra-op Plan:   Post-operative Plan: Extubation in OR  Informed Consent: I have reviewed the patients History and Physical, chart, labs and discussed the procedure including the risks, benefits and alternatives for the proposed anesthesia with the patient or authorized representative who has indicated his/her understanding and acceptance.   Dental advisory given  Plan Discussed with: CRNA and Surgeon  Anesthesia Plan Comments:        Anesthesia Quick Evaluation

## 2017-10-03 NOTE — Discharge Instructions (Signed)

## 2017-10-04 LAB — BASIC METABOLIC PANEL
Anion gap: 7 (ref 5–15)
BUN: 13 mg/dL (ref 8–23)
CO2: 26 mmol/L (ref 22–32)
CREATININE: 1.23 mg/dL — AB (ref 0.44–1.00)
Calcium: 9.1 mg/dL (ref 8.9–10.3)
Chloride: 105 mmol/L (ref 98–111)
GFR calc non Af Amer: 44 mL/min — ABNORMAL LOW (ref >60)
GFR, EST AFRICAN AMERICAN: 51 mL/min — AB (ref >60)
Glucose, Bld: 175 mg/dL — ABNORMAL HIGH (ref 70–99)
Potassium: 4.3 mmol/L (ref 3.5–5.1)
SODIUM: 138 mmol/L (ref 135–145)

## 2017-10-04 LAB — HEMOGLOBIN AND HEMATOCRIT, BLOOD
HEMATOCRIT: 38.1 % (ref 36.0–46.0)
HEMOGLOBIN: 12.8 g/dL (ref 12.0–15.0)

## 2017-10-04 MED ORDER — HYDROMORPHONE HCL 2 MG PO TABS: 2.0000 mg | ORAL_TABLET | ORAL | 0 refills | Status: AC | PRN

## 2017-10-04 NOTE — Discharge Summary (Signed)
[facility] Discharge Summary  Admit date: 10/03/2017  Discharge date and time: 10/05/2017  Discharge to: Home  Discharge Service: Urology  Discharge Attending Physician:  Alexis Frock, MD  Discharge  Diagnoses: Renal mass  Secondary Diagnosis: Active Problems:   Renal mass   OR Procedures: Procedure(s): XI ROBOTIC ASSITED RADICAL NEPHRECTOMY OPERATIVE ULTRASOUND 10/03/2017   Ancillary Procedures: None   Discharge Day Services: The patient was seen and examined by the Urology team both in the morning and immediately prior to discharge.  Vital signs and laboratory values were stable and within normal limits.  The physical exam was benign and unchanged and all surgical wounds were examined.  Discharge instructions were explained and all questions answered.  Subjective  No acute events overnight. Pain Controlled. No fever or chills.  Objective Patient Vitals for the past 8 hrs:  BP Temp Temp src Pulse Resp SpO2  10/04/17 1043 127/67 99 F (37.2 C) Oral 62 14 96 %   Total I/O In: 532.2 [P.O.:200; I.V.:232.2; IV Piggyback:100] Out: 700 [Urine:700]  General Appearance:        No acute distress Lungs:                      Normal work of breathing on room air Heart:                                Regular rate and rhythm Abdomen:                         Soft, non-tender, non-distended, incisions c/d/i Extremities:                      Warm and well perfused   Hospital Course:  The patient underwent robotic left radical nephrectomy on 10/03/2017.  The patient tolerated the procedure well, was extubated in the OR, and afterwards was taken to the PACU for routine post-surgical care. When stable the patient was transferred to the floor.   The patient did well postoperatively.  The patient's diet was slowly advanced and at the time of discharge was tolerating a regular diet.  The patient was discharged home 2 Day Post-Op, at which point was tolerating a regular solid diet, was  able to void spontaneously, have adequate pain control with P.O. pain medication, and could ambulate without difficulty. The patient will follow up with [place] for post op check.   Condition at Discharge: Improved  Discharge Medications:  Allergies as of 10/04/2017   No Known Allergies     Medication List    STOP taking these medications   celecoxib 200 MG capsule Commonly known as:  CELEBREX     TAKE these medications   BUTRANS 20 MCG/HR Ptwk patch Generic drug:  buprenorphine Place 20 mcg onto the skin once a week.   docusate sodium 100 MG capsule Commonly known as:  COLACE Take 1 capsule (100 mg total) by mouth 2 (two) times daily.   HYDROmorphone 2 MG tablet Commonly known as:  DILAUDID Take 1 tablet (2 mg total) by mouth every 4 (four) hours as needed for severe pain.   ondansetron 4 MG tablet Commonly known as:  ZOFRAN Take 1 tablet (4 mg total) by mouth daily as needed for nausea or vomiting.   traZODone 150 MG tablet Commonly known as:  DESYREL Take 150 mg by mouth at bedtime.

## 2017-10-04 NOTE — Progress Notes (Signed)
This RN resumed care for this patient at 1600. PRN Dilaudid given to pt by request at 1612- was effective. Pt also ambulated in hallway with previous RN. Will continue to assess.

## 2017-10-04 NOTE — Progress Notes (Signed)
Urology Progress Note   1 Day Post-Op  Subjective: NAEON AFVSS Labs stable UOP adequate, now voiding volitionally Ambulating with assistance Tolerating regular diet, passing flatus  Objective: Vital signs in last 24 hours: Temp:  [96.2 F (35.7 C)-99 F (37.2 C)] 99 F (37.2 C) (08/17 1043) Pulse Rate:  [52-106] 62 (08/17 1043) Resp:  [11-19] 14 (08/17 1043) BP: (127-168)/(67-103) 127/67 (08/17 1043) SpO2:  [96 %-100 %] 96 % (08/17 1043)  Intake/Output from previous day: 08/16 0701 - 08/17 0700 In: 5538 [P.O.:120; I.V.:4918; IV YPPJKDTOI:712] Out: 2000 [WPYKD:9833; Blood:50] Intake/Output this shift: Total I/O In: 532.2 [P.O.:200; I.V.:232.2; IV Piggyback:100] Out: 700 [Urine:700]  Physical Exam:  General: Alert and oriented CV: RRR Lungs: Clear Abdomen: Soft, appropriately tender. Incisions c/d/i Ext: NT, No erythema  Lab Results: Recent Labs    10/03/17 1125 10/03/17 1738 10/04/17 0356  HGB 14.6 12.9 12.8  HCT 43.0 38.6 38.1   BMET Recent Labs    10/03/17 1125 10/04/17 0356  NA 139 138  K 4.0 4.3  CL  --  105  CO2  --  26  GLUCOSE 101* 175*  BUN  --  13  CREATININE  --  1.23*  CALCIUM  --  9.1     Studies/Results: No results found.  Assessment/Plan:  67 y.o. female s/p robotic left radical nephrectomy.  Overall doing well post-op.   1) SL IVF, regular diet 2) Ambulate, Incentive spirometry 3) Transition to oral pain medication 4) D/C Foley catheter 5) Plan for likely discharge tomorrow morning  Dispo: Floor   LOS: 1 day   Brydon Spahr Rob Bunting 10/04/2017, 3:49 PM

## 2017-10-04 NOTE — Progress Notes (Signed)
Pt refusing to walk. RN and nurse tech tried to get pt to walk and explained importance of walking, but pt states she needs to rest. Will continue to monitor.

## 2017-10-05 NOTE — Anesthesia Postprocedure Evaluation (Signed)
Anesthesia Post Note  Patient: Tonya Cain  Procedure(s) Performed: XI ROBOTIC ASSITED RADICAL NEPHRECTOMY (Left ) OPERATIVE ULTRASOUND (Left )     Patient location during evaluation: PACU Anesthesia Type: General Level of consciousness: awake and alert Pain management: pain level controlled Vital Signs Assessment: post-procedure vital signs reviewed and stable Respiratory status: spontaneous breathing, nonlabored ventilation and respiratory function stable Cardiovascular status: blood pressure returned to baseline and stable Postop Assessment: no apparent nausea or vomiting Anesthetic complications: no    Last Vitals:  Vitals:   10/05/17 0642 10/05/17 0806  BP: (!) 190/92 (!) 170/82  Pulse: (!) 57 (!) 51  Resp: 18   Temp: 37.6 C   SpO2: 95%     Last Pain:  Vitals:   10/05/17 0728  TempSrc:   PainSc: 5                  Ajiah Mcglinn,W. EDMOND

## 2017-10-05 NOTE — Progress Notes (Signed)
CM notified about Condition 44, told RN to proceed with discharge.  Barbee Shropshire. Brigitte Pulse, RN

## 2017-10-05 NOTE — Progress Notes (Signed)
Pt BP 190/92, confirmed with manual. Urology paged, awaiting orders. Pt had an episode of emesis and complained of intense pain. Zofran and dilaudid given. Will continue to monitor closely.

## 2017-10-06 ENCOUNTER — Encounter (HOSPITAL_COMMUNITY): Payer: Self-pay | Admitting: Urology

## 2017-10-08 ENCOUNTER — Other Ambulatory Visit: Payer: Self-pay | Admitting: *Deleted

## 2017-10-08 NOTE — Patient Outreach (Signed)
[facility]) Care Management  10/08/2017  BARBI KUMAGAI 09-02-1950 [id number]   EMMI-  general discharge     RED ON EMMI ALERT Day # 1 Date: 10/07/17 1351 Red Alert Reason: Know who to call about changes in condition? no   Outreach attempt # 1 No answer. [facility] RN CM left HIPAA compliant voicemail message along with CM's contact info.    Plan: [facility] RN CM sent an unsuccessful outreach letter and scheduled this patient for another call attempt within 4 business days  Alexzavier Girardin L. Lavina Hamman, RN, BSN, [place] Management Care Coordinator Direct Number [phone] Mobile number [phone]  Main [facility] number [phone] Fax number [phone]

## 2017-10-09 ENCOUNTER — Other Ambulatory Visit: Payer: Self-pay | Admitting: *Deleted

## 2017-10-09 NOTE — Patient Outreach (Signed)
[facility]) Care Management  10/09/2017  Tonya Cain 06-25-1950 [id number]   Care coordination   [facility] RN CM made another attempt to reach Tonya Cain after 2 pm 10/09/17 as discussed   No answer. [facility] RN CM left HIPAA compliant voicemail message along with CM's contact info.   [facility] RN CM scheduled this patient for another call attempt within 4 business days or if no response plan for case closure   Marios Gaiser L. Lavina Hamman, RN, BSN, [place] Coordinator Office number [phone] Mobile number [phone]  Main [facility] number [phone] Fax number [phone]

## 2017-10-09 NOTE — Patient Outreach (Signed)
[facility]) Care Management  10/09/2017  Tonya Cain 1950-10-09 [id number]   EMMI-   general discharge                                           RED ON EMMI ALERT Day # 1 Date: 10/07/17 1351 Red Alert Reason: Know who to call about changes in condition? no   Outreach attempt # 1 successful but pt was not able to speak long related to being in public area   Tonya Cain was able to confirm that the response recorded by EMMI was incorrect She reports knowing now who to call about changes in her condition [facility] RN CM did review her follow up appointment information to Dr Lovena Neighbours on 10/16/17 at 70 at the [address] address   Tonya Cain was d/c on 10/05/17 after a XI robotic assisted left radical nephrectomy operative [place] on 10/03/17    Plan: [facility] RN CM will attempt to reach patient again at a later time discussed to continue assessment   Kimberly L. Lavina Hamman, RN, BSN, [place] Management Care Coordinator Direct Number [phone] Mobile number [phone]  Main [facility] number [phone] Fax number [phone]

## 2017-10-13 ENCOUNTER — Ambulatory Visit: Payer: Self-pay | Admitting: *Deleted

## 2017-10-14 ENCOUNTER — Other Ambulatory Visit: Payer: Self-pay | Admitting: *Deleted

## 2017-10-14 NOTE — Patient Outreach (Signed)
[facility]) Care Management  10/14/2017  Tonya Cain January 21, 1951 [id number]   EMMI-   general discharge                                           RED ON EMMI ALERT Day # 1 Date: 10/07/17 1351 Red Alert Reason: Know who to call about changes in condition? no   Outreach attempt # 3 [facility] RN CM was able to reach [name] today. Again today she states the call is not "good timing." She confirms she has received [facility] RN CM letter. She apologizes but states she does know who to call about changes in her condition  Plan: [facility] RN CM discussed briefly the [place] per the Brochure sent in the mail and encouraged her to return a call to CM prn  [facility] RN CM will close case at this time as patient has been assessed and no needs identified.    Kimberly L. Lavina Hamman, RN, BSN, [place] Management Care Coordinator Direct Number [phone] Mobile number [phone]  Main [facility] number [phone] Fax number [phone]

## 2017-10-16 DIAGNOSIS — C642 Malignant neoplasm of left kidney, except renal pelvis: Secondary | ICD-10-CM | POA: Diagnosis not present

## 2017-10-23 ENCOUNTER — Other Ambulatory Visit (HOSPITAL_BASED_OUTPATIENT_CLINIC_OR_DEPARTMENT_OTHER): Payer: Self-pay | Admitting: Family Medicine

## 2017-10-23 ENCOUNTER — Ambulatory Visit (HOSPITAL_BASED_OUTPATIENT_CLINIC_OR_DEPARTMENT_OTHER)
Admission: RE | Admit: 2017-10-23 | Discharge: 2017-10-23 | Disposition: A | Discharge status: Home / Self Care | Payer: Medicare Other | Source: Ambulatory Visit | Attending: Family Medicine | Admitting: Family Medicine

## 2017-10-23 DIAGNOSIS — R079 Chest pain, unspecified: Secondary | ICD-10-CM | POA: Diagnosis not present

## 2017-10-23 DIAGNOSIS — R0789 Other chest pain: Secondary | ICD-10-CM | POA: Diagnosis not present

## 2017-10-23 DIAGNOSIS — Z85528 Personal history of other malignant neoplasm of kidney: Secondary | ICD-10-CM | POA: Diagnosis not present

## 2017-10-23 MED ORDER — IOPAMIDOL (ISOVUE-370) INJECTION 76%
100.0000 mL | Freq: Once | INTRAVENOUS | Status: AC | PRN
  Administered 2017-10-23: 80 mL via INTRAVENOUS

## 2017-10-31 DIAGNOSIS — J302 Other seasonal allergic rhinitis: Secondary | ICD-10-CM | POA: Diagnosis not present

## 2017-11-11 ENCOUNTER — Ambulatory Visit: Payer: Medicare Other | Admitting: Cardiology

## 2017-11-11 ENCOUNTER — Encounter

## 2017-12-19 DIAGNOSIS — Z681 Body mass index (BMI) 19 or less, adult: Secondary | ICD-10-CM | POA: Diagnosis not present

## 2017-12-19 DIAGNOSIS — R5383 Other fatigue: Secondary | ICD-10-CM | POA: Diagnosis not present

## 2017-12-19 DIAGNOSIS — Z23 Encounter for immunization: Secondary | ICD-10-CM | POA: Diagnosis not present

## 2017-12-19 DIAGNOSIS — R197 Diarrhea, unspecified: Secondary | ICD-10-CM | POA: Diagnosis not present

## 2017-12-19 DIAGNOSIS — R531 Weakness: Secondary | ICD-10-CM | POA: Diagnosis not present

## 2017-12-24 DIAGNOSIS — R197 Diarrhea, unspecified: Secondary | ICD-10-CM | POA: Diagnosis not present

## 2017-12-24 DIAGNOSIS — R531 Weakness: Secondary | ICD-10-CM | POA: Diagnosis not present

## 2017-12-24 DIAGNOSIS — M79641 Pain in right hand: Secondary | ICD-10-CM | POA: Diagnosis not present

## 2017-12-24 DIAGNOSIS — M25541 Pain in joints of right hand: Secondary | ICD-10-CM | POA: Diagnosis not present

## 2017-12-24 DIAGNOSIS — M60841 Other myositis, right hand: Secondary | ICD-10-CM | POA: Diagnosis not present

## 2017-12-24 DIAGNOSIS — R5383 Other fatigue: Secondary | ICD-10-CM | POA: Diagnosis not present

## 2017-12-26 DIAGNOSIS — R197 Diarrhea, unspecified: Secondary | ICD-10-CM | POA: Diagnosis not present

## 2018-01-23 DIAGNOSIS — Z905 Acquired absence of kidney: Secondary | ICD-10-CM | POA: Diagnosis not present

## 2018-01-23 DIAGNOSIS — C642 Malignant neoplasm of left kidney, except renal pelvis: Secondary | ICD-10-CM | POA: Diagnosis not present

## 2018-01-23 DIAGNOSIS — K769 Liver disease, unspecified: Secondary | ICD-10-CM | POA: Diagnosis not present

## 2018-01-23 DIAGNOSIS — C649 Malignant neoplasm of unspecified kidney, except renal pelvis: Secondary | ICD-10-CM | POA: Diagnosis not present

## 2018-01-29 DIAGNOSIS — R079 Chest pain, unspecified: Secondary | ICD-10-CM | POA: Diagnosis not present

## 2018-01-29 DIAGNOSIS — R0789 Other chest pain: Secondary | ICD-10-CM | POA: Diagnosis not present

## 2018-01-29 DIAGNOSIS — C642 Malignant neoplasm of left kidney, except renal pelvis: Secondary | ICD-10-CM | POA: Diagnosis not present

## 2018-02-05 DIAGNOSIS — Z905 Acquired absence of kidney: Secondary | ICD-10-CM | POA: Diagnosis not present

## 2018-02-05 DIAGNOSIS — C642 Malignant neoplasm of left kidney, except renal pelvis: Secondary | ICD-10-CM | POA: Diagnosis not present

## 2018-02-05 DIAGNOSIS — R0789 Other chest pain: Secondary | ICD-10-CM | POA: Diagnosis not present

## 2018-02-06 DIAGNOSIS — R0789 Other chest pain: Secondary | ICD-10-CM | POA: Diagnosis not present

## 2018-02-13 DIAGNOSIS — M25541 Pain in joints of right hand: Secondary | ICD-10-CM | POA: Diagnosis not present

## 2018-02-13 DIAGNOSIS — H722X2 Other marginal perforations of tympanic membrane, left ear: Secondary | ICD-10-CM | POA: Diagnosis not present

## 2018-02-13 DIAGNOSIS — R0982 Postnasal drip: Secondary | ICD-10-CM | POA: Diagnosis not present

## 2018-04-27 DIAGNOSIS — H905 Unspecified sensorineural hearing loss: Secondary | ICD-10-CM | POA: Diagnosis not present

## 2018-04-27 DIAGNOSIS — H903 Sensorineural hearing loss, bilateral: Secondary | ICD-10-CM | POA: Diagnosis not present

## 2018-06-09 DIAGNOSIS — M7701 Medial epicondylitis, right elbow: Secondary | ICD-10-CM | POA: Diagnosis not present

## 2018-06-29 DIAGNOSIS — R109 Unspecified abdominal pain: Secondary | ICD-10-CM | POA: Diagnosis not present

## 2018-06-29 DIAGNOSIS — Z905 Acquired absence of kidney: Secondary | ICD-10-CM | POA: Diagnosis not present

## 2018-06-29 DIAGNOSIS — C642 Malignant neoplasm of left kidney, except renal pelvis: Secondary | ICD-10-CM | POA: Diagnosis not present

## 2018-07-02 DIAGNOSIS — C642 Malignant neoplasm of left kidney, except renal pelvis: Secondary | ICD-10-CM | POA: Diagnosis not present

## 2018-07-02 DIAGNOSIS — R109 Unspecified abdominal pain: Secondary | ICD-10-CM | POA: Diagnosis not present

## 2018-08-20 DIAGNOSIS — Z85828 Personal history of other malignant neoplasm of skin: Secondary | ICD-10-CM | POA: Diagnosis not present

## 2018-08-20 DIAGNOSIS — L99 Other disorders of skin and subcutaneous tissue in diseases classified elsewhere: Secondary | ICD-10-CM | POA: Diagnosis not present

## 2018-08-20 DIAGNOSIS — D229 Melanocytic nevi, unspecified: Secondary | ICD-10-CM | POA: Diagnosis not present

## 2018-08-20 DIAGNOSIS — L821 Other seborrheic keratosis: Secondary | ICD-10-CM | POA: Diagnosis not present

## 2018-08-21 DIAGNOSIS — Z809 Family history of malignant neoplasm, unspecified: Secondary | ICD-10-CM | POA: Diagnosis not present

## 2018-08-21 DIAGNOSIS — Z85528 Personal history of other malignant neoplasm of kidney: Secondary | ICD-10-CM | POA: Diagnosis not present

## 2018-09-01 DIAGNOSIS — G8929 Other chronic pain: Secondary | ICD-10-CM | POA: Diagnosis not present

## 2018-09-02 DIAGNOSIS — G8929 Other chronic pain: Secondary | ICD-10-CM | POA: Diagnosis not present

## 2018-09-13 DIAGNOSIS — Z85528 Personal history of other malignant neoplasm of kidney: Secondary | ICD-10-CM | POA: Diagnosis not present

## 2018-11-05 DIAGNOSIS — M459 Ankylosing spondylitis of unspecified sites in spine: Secondary | ICD-10-CM | POA: Diagnosis not present

## 2018-11-05 DIAGNOSIS — R0982 Postnasal drip: Secondary | ICD-10-CM | POA: Diagnosis not present

## 2018-11-05 DIAGNOSIS — G8929 Other chronic pain: Secondary | ICD-10-CM | POA: Diagnosis not present

## 2018-11-05 DIAGNOSIS — B009 Herpesviral infection, unspecified: Secondary | ICD-10-CM | POA: Diagnosis not present

## 2018-12-01 DIAGNOSIS — M25561 Pain in right knee: Secondary | ICD-10-CM | POA: Diagnosis not present

## 2018-12-01 DIAGNOSIS — Z1389 Encounter for screening for other disorder: Secondary | ICD-10-CM | POA: Diagnosis not present

## 2018-12-01 DIAGNOSIS — G9050 Complex regional pain syndrome I, unspecified: Secondary | ICD-10-CM | POA: Diagnosis not present

## 2018-12-01 DIAGNOSIS — Z79899 Other long term (current) drug therapy: Secondary | ICD-10-CM | POA: Diagnosis not present

## 2018-12-01 DIAGNOSIS — M542 Cervicalgia: Secondary | ICD-10-CM | POA: Diagnosis not present

## 2018-12-01 DIAGNOSIS — G894 Chronic pain syndrome: Secondary | ICD-10-CM | POA: Diagnosis not present

## 2018-12-01 DIAGNOSIS — G8929 Other chronic pain: Secondary | ICD-10-CM | POA: Diagnosis not present

## 2018-12-01 DIAGNOSIS — M546 Pain in thoracic spine: Secondary | ICD-10-CM | POA: Diagnosis not present

## 2018-12-01 DIAGNOSIS — M452 Ankylosing spondylitis of cervical region: Secondary | ICD-10-CM | POA: Diagnosis not present

## 2018-12-09 DIAGNOSIS — M459 Ankylosing spondylitis of unspecified sites in spine: Secondary | ICD-10-CM | POA: Diagnosis not present

## 2018-12-09 DIAGNOSIS — R0982 Postnasal drip: Secondary | ICD-10-CM | POA: Diagnosis not present

## 2018-12-09 DIAGNOSIS — G8929 Other chronic pain: Secondary | ICD-10-CM | POA: Diagnosis not present

## 2018-12-09 DIAGNOSIS — B009 Herpesviral infection, unspecified: Secondary | ICD-10-CM | POA: Diagnosis not present

## 2018-12-22 DIAGNOSIS — Z85528 Personal history of other malignant neoplasm of kidney: Secondary | ICD-10-CM | POA: Diagnosis not present

## 2018-12-22 DIAGNOSIS — Z905 Acquired absence of kidney: Secondary | ICD-10-CM | POA: Diagnosis not present

## 2019-01-04 DIAGNOSIS — G894 Chronic pain syndrome: Secondary | ICD-10-CM | POA: Diagnosis not present

## 2019-01-04 DIAGNOSIS — M25561 Pain in right knee: Secondary | ICD-10-CM | POA: Diagnosis not present

## 2019-01-04 DIAGNOSIS — Z79899 Other long term (current) drug therapy: Secondary | ICD-10-CM | POA: Diagnosis not present

## 2019-01-04 DIAGNOSIS — G9050 Complex regional pain syndrome I, unspecified: Secondary | ICD-10-CM | POA: Diagnosis not present

## 2019-01-04 DIAGNOSIS — G8929 Other chronic pain: Secondary | ICD-10-CM | POA: Diagnosis not present

## 2019-01-04 DIAGNOSIS — Z1389 Encounter for screening for other disorder: Secondary | ICD-10-CM | POA: Diagnosis not present

## 2019-01-19 DIAGNOSIS — H60502 Unspecified acute noninfective otitis externa, left ear: Secondary | ICD-10-CM | POA: Diagnosis not present

## 2019-01-19 DIAGNOSIS — H6123 Impacted cerumen, bilateral: Secondary | ICD-10-CM | POA: Diagnosis not present

## 2020-01-26 ENCOUNTER — Ambulatory Visit: Attending: Orthopaedic Surgery | Primary: Emergency Medicine

## 2020-01-26 ENCOUNTER — Ambulatory Visit: Admit: 2020-01-26 | Discharge: 2020-01-26 | Payer: MEDICARE | Attending: Orthopaedic Surgery

## 2020-01-26 DIAGNOSIS — M79605 Pain in left leg: Secondary | ICD-10-CM

## 2020-01-26 NOTE — Progress Notes (Signed)
Name: Sandra Mercer    DOB: 1950-10-05     Service Dept: [facility]    Chief Complaint   Patient presents with   ??? Knee Pain        Visit Vitals  Ht 5\' 4"  (1.626 m)   Wt 116 lb (52.6 kg)   BMI 19.91 kg/m??        No Known Allergies     Current Outpatient Medications   Medication Sig Dispense Refill   ??? HYDROcodone-acetaminophen (NORCO) 7.5-325 mg per tablet take ONE tablet every SIX hours as needed FOR pain        There is no problem list on file for this patient.     History reviewed. No pertinent family history.   Social History     Socioeconomic History   ??? Marital status: DIVORCED   Tobacco Use   ??? Smoking status: Never Smoker   ??? Smokeless tobacco: Never Used   Substance and Sexual Activity   ??? Alcohol use: Yes      History reviewed. No pertinent surgical history.   History reviewed. No pertinent past medical history.     I have reviewed and agree with [place] and ROS and intake form in chart and the record furthermore I have reviewed prior medical record(s) regarding this patients care during this appointment.     Review of Systems:   Patient is a pleasant appearing individual, appropriately dressed, well hydrated, well nourished, who is alert, appropriately oriented for age, and in no acute distress with a normal gait and normal affect who does not appear to be in any significant pain.     Physical Exam:  Left wrist - grossly neurovascularly intact, good cap refill, positive tenderness to palpation, positive soft tissue swelling, decreased range of motion, decreased strength, skin intact.    Right wrist- Grossly neurovascularly intact, good cap refill, full range of motion, no weakness, no swelling, no point tenderness, no skin lesions.    Left Hip - Slight decrease range of motion, Positive crepitation, Grossly neurovascularly intact, Good cap refill, No skin lesion, mild swelling, No gross instability, Some weakness, Positive groin pain, No point tenderness on the  greater trochanter.     Right Hip - Normal range of motion, No crepitation, Grossly neurovascularly intact, Good cap refill, No skin lesion, mild swelling, No gross instability, No weakness, No groin pain, No point tenderness on the greater trochanter.     Encounter Diagnoses     ICD-10-CM ICD-9-CM   1. Left leg pain  M79.605 729.5   2. Left hip pain  M25.552 719.45   3. Left wrist pain  M25.532 719.43   4. Left thigh pain  M79.652 729.5       HPI:  The patient is here with a chief complaint of left wrist sprain and left thigh pain.  Has been having pain in the posterior aspect of the thigh ever since she fell about 7 weeks ago.  It has been the same.  Pain is 10/10.    ROS:  10-point review of systems is positive for joint swelling, nighttime pain, and fatigue.    Assessment/Plan:  1.  Left wrist sprain and left hamstring strain.    Plan at this point, ultrasound treatment, activities as tolerated, weightbearing started, and also physical therapy for her wrist.  If she is not better, we will consider an MRI and go from there.  We will see her back as needed.  As part of continued conservative pain management options the patient was advised to utilize Tylenol or OTC NSAIDS as long as it is not medically contraindicated.     Return to Office:   Follow-up and Dispositions    ?? Return for PRN.           Scribed by Briscoe Burns as dictated by [facility] A. Posey Pronto, MD.  Documentation True and Accepted Soul Deveney A. Posey Pronto, MD

## 2020-02-18 IMAGING — CR DG CHEST 2V
2 series · 2 of 2 positions shown · non-contrast
Comparison: 01/13/2013

CLINICAL DATA: Renal cell carcinoma

EXAM:
CHEST - 2 VIEW

[w chest pa]
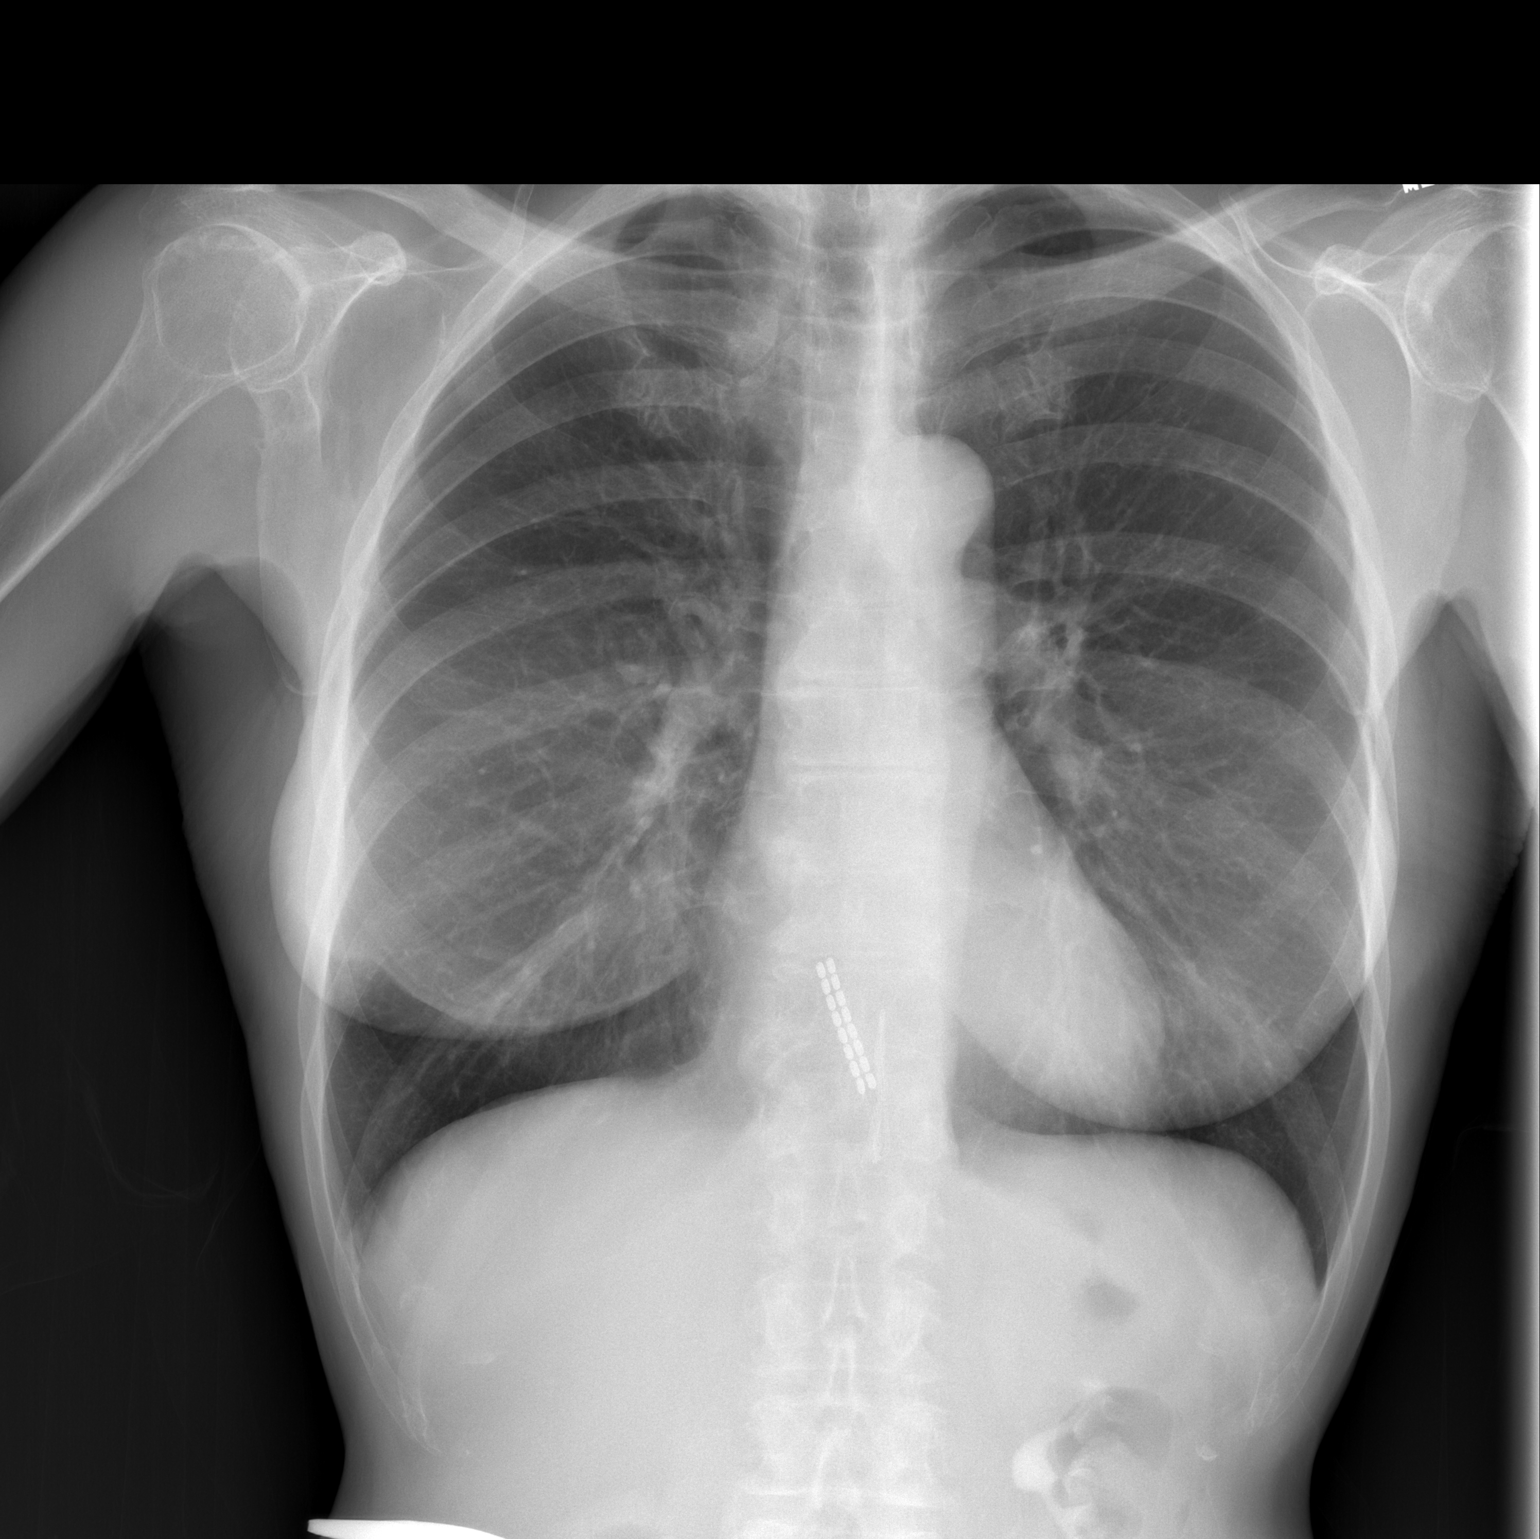

[w chest lat]
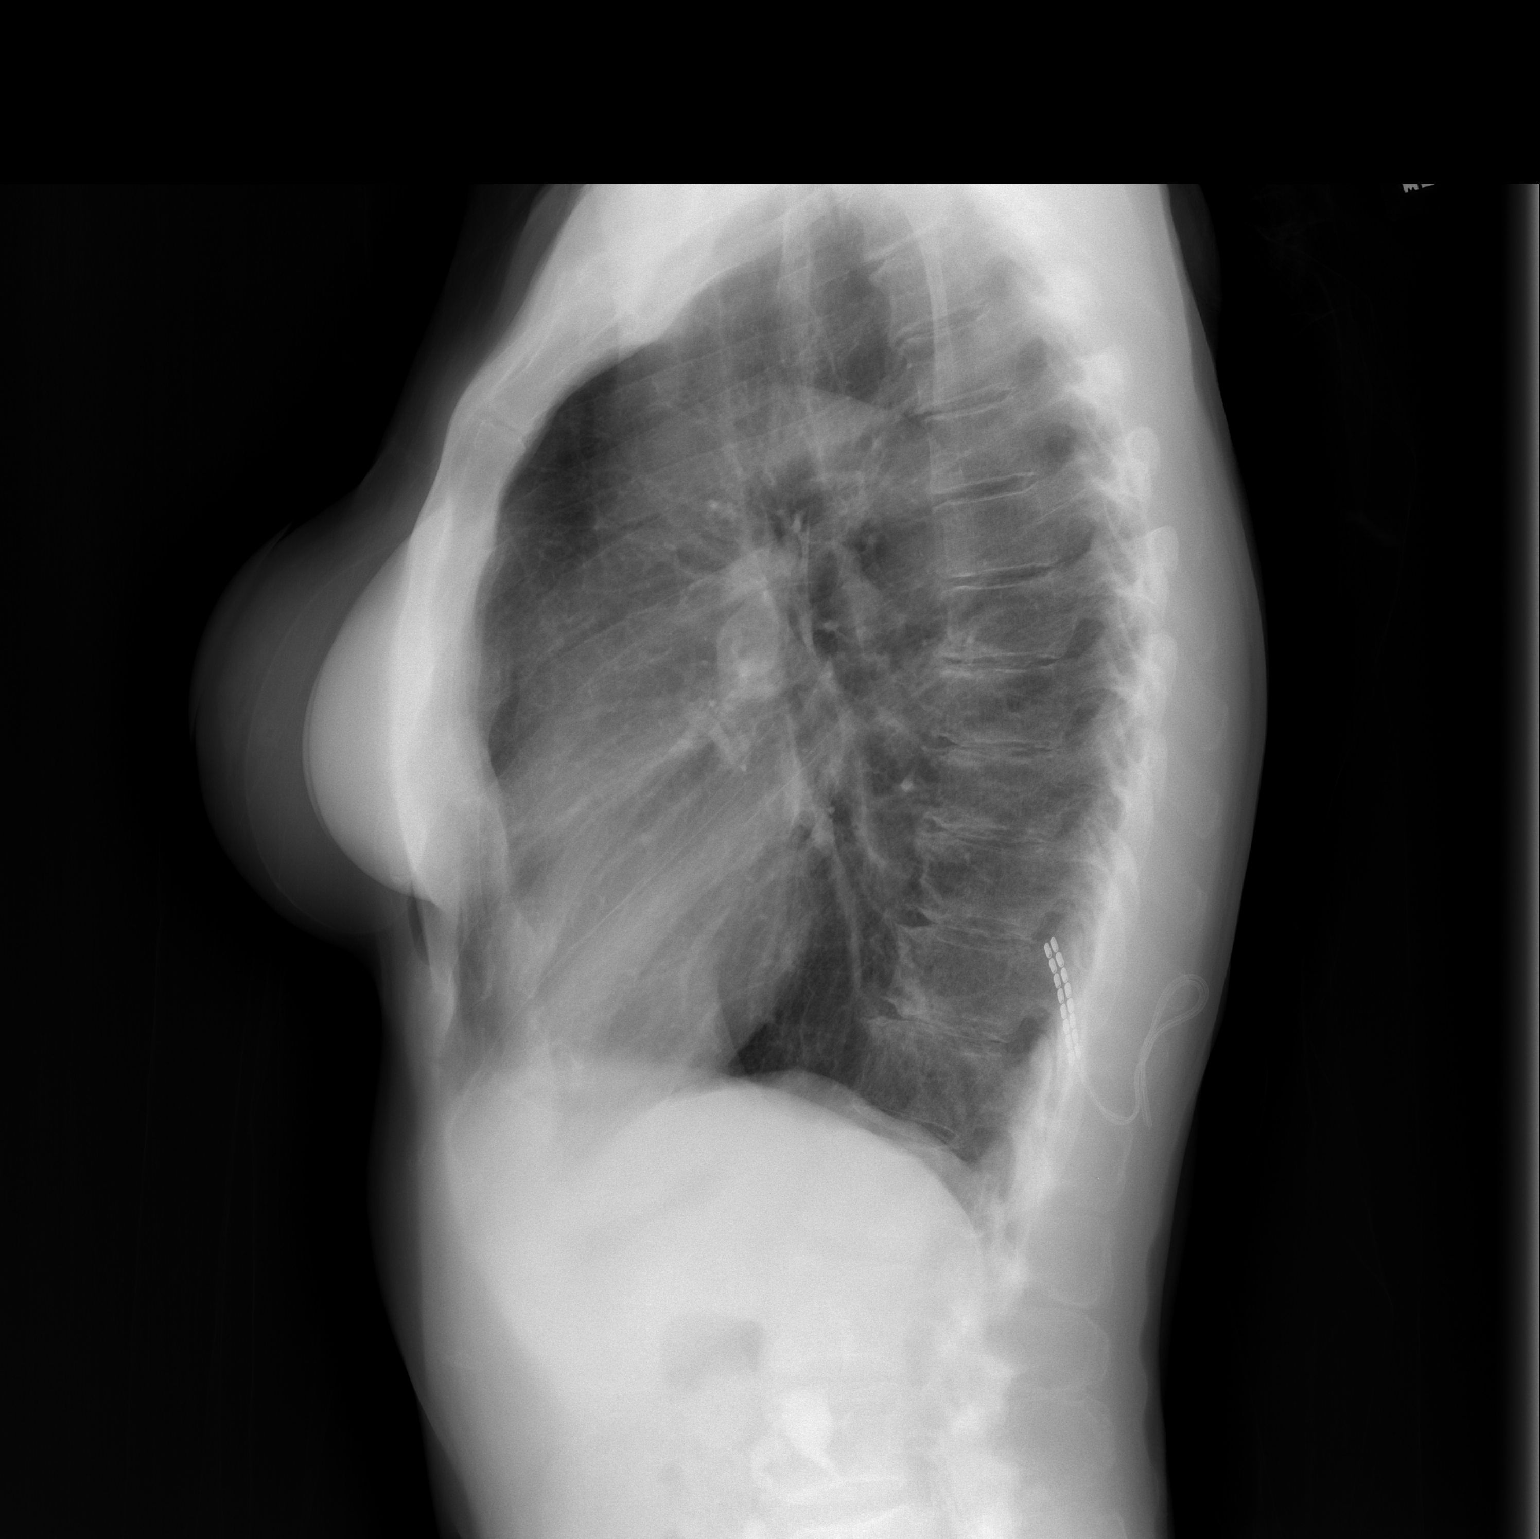

[2 of 2 positions shown; findings below may reference images not displayed]

FINDINGS: Normal heart size. Lungs hyperaerated and clear. Lower thoracic
spinal cord stimulator is stable. No pneumothorax or pleural
effusion.
IMPRESSION: No acute cardiopulmonary disease.

## 2020-06-26 ENCOUNTER — Inpatient Hospital Stay
Admit: 2020-06-26 | Discharge: 2020-06-26 | Disposition: A | Discharge status: Home / Self Care | Payer: MEDICARE | Attending: Emergency Medicine

## 2020-06-26 ENCOUNTER — Emergency Department: Admit: 2020-06-26 | Payer: MEDICARE | Primary: Emergency Medicine

## 2020-06-26 DIAGNOSIS — K219 Gastro-esophageal reflux disease without esophagitis: Secondary | ICD-10-CM

## 2020-06-26 LAB — COMPREHENSIVE METABOLIC PANEL
ALT: 21 U/L (ref 12–78)
AST: 31 U/L (ref 15–37)
Albumin/Globulin Ratio: 1.1 (ref 1.1–2.2)
Albumin: 3.8 g/dL (ref 3.5–5.0)
Alkaline Phosphatase: 80 U/L (ref 45–117)
Anion Gap: 4 mmol/L — ABNORMAL LOW (ref 5–15)
BUN: 17 mg/dL (ref 6–20)
Bun/Cre Ratio: 13 (ref 12–20)
CO2: 28 mmol/L (ref 21–32)
Calcium: 9.1 mg/dL (ref 8.5–10.1)
Chloride: 102 mmol/L (ref 97–108)
Creatinine: 1.28 mg/dL — ABNORMAL HIGH (ref 0.55–1.02)
EGFR IF NonAfrican American: 41 mL/min/{1.73_m2} — ABNORMAL LOW (ref >60)
GFR African American: 50 mL/min/{1.73_m2} — ABNORMAL LOW (ref >60)
Globulin: 3.4 g/dL (ref 2.0–4.0)
Glucose: 107 mg/dL — ABNORMAL HIGH (ref 65–100)
Potassium: 4.5 mmol/L (ref 3.5–5.1)
Sodium: 134 mmol/L — ABNORMAL LOW (ref 136–145)
Total Bilirubin: 1 mg/dL (ref 0.2–1.0)
Total Protein: 7.2 g/dL (ref 6.4–8.2)

## 2020-06-26 LAB — CBC WITH AUTO DIFFERENTIAL
Basophils %: 1 % (ref 0–1)
Basophils Absolute: 0 10*3/uL (ref 0.0–0.1)
Eosinophils %: 0 % (ref 0–7)
Eosinophils Absolute: 0 10*3/uL (ref 0.0–0.4)
Granulocyte Absolute Count: 0 10*3/uL (ref 0.00–0.04)
Hematocrit: 43.1 % (ref 35.0–47.0)
Hemoglobin: 14 g/dL (ref 11.5–16.0)
Immature Granulocytes: 0 % (ref 0–0.5)
Lymphocytes %: 28 % (ref 12–49)
Lymphocytes Absolute: 2 10*3/uL (ref 0.8–3.5)
MCH: 31.5 PG (ref 26.0–34.0)
MCHC: 32.5 g/dL (ref 30.0–36.5)
MCV: 96.9 FL (ref 80.0–99.0)
MPV: 11.7 FL (ref 8.9–12.9)
Monocytes %: 12 % (ref 5–13)
Monocytes Absolute: 0.9 10*3/uL (ref 0.0–1.0)
NRBC Absolute: 0 10*3/uL (ref 0.00–0.01)
Neutrophils %: 59 % (ref 32–75)
Neutrophils Absolute: 4.2 10*3/uL (ref 1.8–8.0)
Nucleated RBCs: 0 PER 100 WBC
Platelets: 288 10*3/uL (ref 150–400)
RBC: 4.45 M/uL (ref 3.80–5.20)
RDW: 12.6 % (ref 11.5–14.5)
WBC: 7.1 10*3/uL (ref 3.6–11.0)

## 2020-06-26 LAB — EKG 12-LEAD
Atrial Rate: 63 {beats}/min
Diagnosis: NORMAL
P Axis: 62 degrees
P-R Interval: 162 ms
Q-T Interval: 408 ms
QRS Duration: 92 ms
QTc Calculation (Bazett): 417 ms
R Axis: -15 degrees
T Axis: 67 degrees
Ventricular Rate: 63 {beats}/min

## 2020-06-26 LAB — LIPASE
Lipase: 86 U/L (ref 73–393)
Lipase: 86 U/L (ref 73–393)

## 2020-06-26 LAB — TROPONIN, HIGH SENSITIVITY: Troponin, High Sensitivity: 7 ng/L (ref 0–51)

## 2020-06-26 LAB — CBC WITH AUTOMATED DIFF
ABS. BASOPHILS: 0 10*3/uL (ref 0.0–0.1)
ABS. EOSINOPHILS: 0 10*3/uL (ref 0.0–0.4)
ABS. IMM. GRANS.: 0 10*3/uL (ref 0.00–0.04)
ABS. LYMPHOCYTES: 2 10*3/uL (ref 0.8–3.5)
ABS. MONOCYTES: 0.9 10*3/uL (ref 0.0–1.0)
ABS. NEUTROPHILS: 4.2 10*3/uL (ref 1.8–8.0)
ABSOLUTE NRBC: 0 10*3/uL (ref 0.00–0.01)
BASOPHILS: 1 % (ref 0–1)
EOSINOPHILS: 0 % (ref 0–7)
HCT: 43.1 % (ref 35.0–47.0)
HGB: 14 g/dL (ref 11.5–16.0)
IMMATURE GRANULOCYTES: 0 % (ref 0–0.5)
LYMPHOCYTES: 28 % (ref 12–49)
MCH: 31.5 PG (ref 26.0–34.0)
MCHC: 32.5 g/dL (ref 30.0–36.5)
MCV: 96.9 FL (ref 80.0–99.0)
MONOCYTES: 12 % (ref 5–13)
MPV: 11.7 FL (ref 8.9–12.9)
NEUTROPHILS: 59 % (ref 32–75)
NRBC: 0 PER 100 WBC
PLATELET: 288 10*3/uL (ref 150–400)
RBC: 4.45 M/uL (ref 3.80–5.20)
RDW: 12.6 % (ref 11.5–14.5)
WBC: 7.1 10*3/uL (ref 3.6–11.0)

## 2020-06-26 LAB — TROPONIN-HIGH SENSITIVITY: Troponin-High Sensitivity: 7 ng/L (ref 0–51)

## 2020-06-26 LAB — METABOLIC PANEL, COMPREHENSIVE
A-G Ratio: 1.1 (ref 1.1–2.2)
ALT (SGPT): 21 U/L (ref 12–78)
AST (SGOT): 31 U/L (ref 15–37)
Albumin: 3.8 g/dL (ref 3.5–5.0)
Alk. phosphatase: 80 U/L (ref 45–117)
Anion gap: 4 mmol/L — ABNORMAL LOW (ref 5–15)
BUN/Creatinine ratio: 13 (ref 12–20)
BUN: 17 mg/dL (ref 6–20)
Bilirubin, total: 1 mg/dL (ref 0.2–1.0)
CO2: 28 mmol/L (ref 21–32)
Calcium: 9.1 mg/dL (ref 8.5–10.1)
Chloride: 102 mmol/L (ref 97–108)
Creatinine: 1.28 mg/dL — ABNORMAL HIGH (ref 0.55–1.02)
GFR est AA: 50 mL/min/{1.73_m2} — ABNORMAL LOW (ref >60)
GFR est non-AA: 41 mL/min/{1.73_m2} — ABNORMAL LOW (ref >60)
Globulin: 3.4 g/dL (ref 2.0–4.0)
Glucose: 107 mg/dL — ABNORMAL HIGH (ref 65–100)
Potassium: 4.5 mmol/L (ref 3.5–5.1)
Protein, total: 7.2 g/dL (ref 6.4–8.2)
Sodium: 134 mmol/L — ABNORMAL LOW (ref 136–145)

## 2020-06-26 LAB — EKG, 12 LEAD, INITIAL
Atrial Rate: 63 {beats}/min
Calculated P Axis: 62 degrees
Calculated R Axis: -15 degrees
Calculated T Axis: 67 degrees
Diagnosis: NORMAL
P-R Interval: 162 ms
Q-T Interval: 408 ms
QRS Duration: 92 ms
QTC Calculation (Bezet): 417 ms
Ventricular Rate: 63 {beats}/min

## 2020-06-26 MED ORDER — FAMOTIDINE 20 MG TAB
20 mg | ORAL | Status: DC
Start: 2020-06-26 — End: 2020-06-26

## 2020-06-26 NOTE — ED Notes (Signed)
Pt arrives after having chest pressure and tightness last night. She stated she was having bilateral arm pain, epigastric pain and pain in the middle of her back.

## 2020-06-26 NOTE — ED Provider Notes (Signed)
ED Provider Notes by Ander Gaster, MD at 06/26/20 1327                Author: Ander Gaster, MD  Service: --  Author Type: Physician       Filed: 06/26/20 1504  Date of Service: 06/26/20 1327  Status: Signed          Editor: Ander Gaster, MD (Physician)               EMERGENCY DEPARTMENT HISTORY AND PHYSICAL EXAM           Date: 06/26/2020   Patient Name: Sandra Mercer        History of Presenting Illness          Chief Complaint       Patient presents with        ?  Chest Pain           History Provided By: Patient      HPI: Latiana Tomei , 70 y.o. female with a past medical  history significant No significant past medical history presents to the ED with chief complaint of  Chest Pain   .    70 year old female with a strong family history of coronary disease not herself.  Patient had epigastric chest pain yesterday.  Resolved today.  1 to make sure she did not have a heart attack.  No shortness of breath.  No black or bloody stool.  Had pain  that went to her back but none now.  No abdominal tenderness now.               There are no other complaints, changes, or physical findings at this time.      PCP: None        Current Facility-Administered Medications             Medication  Dose  Route  Frequency  Provider  Last Rate  Last Admin              ?  famotidine (PEPCID) tablet 20 mg   20 mg  Oral  NOW  Reegan Mctighe, Marsh Dolly, MD                Current Outpatient Medications          Medication  Sig  Dispense  Refill           ?  HYDROcodone-acetaminophen (NORCO) 7.5-325 mg per tablet  take ONE tablet every SIX hours as needed FOR pain                 Past History        Past Medical History:     Past Medical History:        Diagnosis  Date         ?  RSD (reflex sympathetic dystrophy)             Past Surgical History:   History reviewed. No pertinent surgical history.      Family History:   History reviewed. No pertinent family history.      Social History:     Social History          Tobacco  Use         ?  Smoking status:  Never Smoker     ?  Smokeless tobacco:  Never Used       Substance Use Topics         ?  Alcohol use:  Yes         ?  Drug use:  Not on file           Allergies:   No Known Allergies           Review of Systems     Review of Systems    Constitutional: Negative.  Negative for chills, fatigue and fever.    HENT: Negative.  Negative for congestion, nosebleeds and sore throat.     Eyes: Negative.  Negative for pain, discharge and visual disturbance.    Respiratory: Negative.  Negative for cough, chest tightness and shortness of breath.     Cardiovascular: Negative for chest pain, palpitations and leg swelling.    Gastrointestinal: Positive for abdominal pain. Negative for blood in stool, constipation, diarrhea, nausea and vomiting.    Endocrine: Negative.     Genitourinary: Negative.  Negative for difficulty urinating, dysuria, pelvic pain and vaginal bleeding.    Musculoskeletal: Negative.  Negative for arthralgias, back pain and myalgias.    Skin: Negative.  Negative for rash and wound.    Allergic/Immunologic: Negative.     Neurological: Negative.  Negative for dizziness, syncope, weakness, numbness and headaches.    Hematological: Negative.     Psychiatric/Behavioral: Negative.  Negative for agitation, confusion and suicidal ideas.    All other systems reviewed and are negative.           Physical Exam     Physical Exam   Vitals and nursing note reviewed. Exam conducted with a chaperone present.   Constitutional:        Appearance: Normal appearance. She is normal weight.   HENT :       Head: Normocephalic and atraumatic.      Nose: Nose normal.      Mouth/Throat:      Mouth: Mucous membranes are moist.      Pharynx: Oropharynx is clear.   Eyes:       Extraocular Movements: Extraocular movements intact.      Conjunctiva/sclera: Conjunctivae normal.      Pupils: Pupils are equal, round, and reactive to light.   Cardiovascular :       Rate and Rhythm: Normal rate and regular rhythm.       Pulses: Normal pulses.      Heart sounds: Normal heart sounds.    Pulmonary:       Effort: Pulmonary effort is normal. No respiratory distress.      Breath sounds: Normal breath sounds.    Abdominal:      General: Abdomen is flat. Bowel sounds are normal. There is no distension.      Palpations: Abdomen is soft.      Tenderness: There is no abdominal tenderness. There  is no guarding.     Musculoskeletal:          General: No swelling, tenderness, deformity or signs of injury. Normal range of motion.      Cervical back: Normal range of motion and neck supple.      Right lower leg: No edema.      Left lower leg: No edema.    Skin:      General: Skin is warm and dry.      Capillary Refill: Capillary refill takes less than 2 seconds.      Findings: No lesion or rash.   Neurological:       General: No focal deficit present.      Mental Status: She  is alert and oriented to person, place, and time. Mental status is at baseline.      Cranial Nerves: No cranial nerve deficit.   Psychiatric:         Mood and Affect: Mood normal.         Behavior: Behavior  normal.         Thought Content: Thought content normal.         Judgment: Judgment normal.               Diagnostic Study Results        Labs -         Recent Results (from the past 12 hour(s))     EKG, 12 LEAD, INITIAL          Collection Time: 06/26/20 12:04 PM         Result  Value  Ref Range            Ventricular Rate  63  BPM       Atrial Rate  63  BPM       P-R Interval  162  ms       QRS Duration  92  ms       Q-T Interval  408  ms       QTC Calculation (Bezet)  417  ms       Calculated P Axis  62  degrees       Calculated R Axis  -15  degrees       Calculated T Axis  67  degrees       Diagnosis                 Normal sinus rhythm   Incomplete right bundle branch block   Poor R wave progression   Abnormal ECG   No previous ECGs available   Confirmed by Manuela Neptune [phone]) on 06/26/2020 1:06:29 PM          CBC WITH AUTOMATED DIFF          Collection Time:  06/26/20 12:17 PM         Result  Value  Ref Range            WBC  7.1  3.6 - 11.0 K/uL       RBC  4.45  3.80 - 5.20 M/uL       HGB  14.0  11.5 - 16.0 g/dL       HCT  43.1  35.0 - 47.0 %       MCV  96.9  80.0 - 99.0 FL       MCH  31.5  26.0 - 34.0 PG       MCHC  32.5  30.0 - 36.5 g/dL       RDW  12.6  11.5 - 14.5 %       PLATELET  288  150 - 400 K/uL       MPV  11.7  8.9 - 12.9 FL       NRBC  0.0  0.0 PER 100 WBC       ABSOLUTE NRBC  0.00  0.00 - 0.01 K/uL       NEUTROPHILS  59  32 - 75 %       LYMPHOCYTES  28  12 - 49 %       MONOCYTES  12  5 - 13 %       EOSINOPHILS  0  0 -  7 %       BASOPHILS  1  0 - 1 %       IMMATURE GRANULOCYTES  0  0 - 0.5 %       ABS. NEUTROPHILS  4.2  1.8 - 8.0 K/UL       ABS. LYMPHOCYTES  2.0  0.8 - 3.5 K/UL       ABS. MONOCYTES  0.9  0.0 - 1.0 K/UL       ABS. EOSINOPHILS  0.0  0.0 - 0.4 K/UL       ABS. BASOPHILS  0.0  0.0 - 0.1 K/UL       ABS. IMM. GRANS.  0.0  0.00 - 0.04 K/UL       DF  AUTOMATED          METABOLIC PANEL, COMPREHENSIVE          Collection Time: 06/26/20 12:17 PM         Result  Value  Ref Range            Sodium  134 (L)  136 - 145 mmol/L       Potassium  4.5  3.5 - 5.1 mmol/L       Chloride  102  97 - 108 mmol/L       CO2  28  21 - 32 mmol/L       Anion gap  4 (L)  5 - 15 mmol/L       Glucose  107 (H)  65 - 100 mg/dL       BUN  17  6 - 20 mg/dL       Creatinine  1.28 (H)  0.55 - 1.02 mg/dL       BUN/Creatinine ratio  13  12 - 20         GFR est AA  50 (L)  >60 ml/min/1.17m       GFR est non-AA  41 (L)  >60 ml/min/1.790m      Calcium  9.1  8.5 - 10.1 mg/dL       Bilirubin, total  1.0  0.2 - 1.0 mg/dL       AST (SGOT)  31  15 - 37 U/L       ALT (SGPT)  21  12 - 78 U/L       Alk. phosphatase  80  45 - 117 U/L       Protein, total  7.2  6.4 - 8.2 g/dL       Albumin  3.8  3.5 - 5.0 g/dL       Globulin  3.4  2.0 - 4.0 g/dL       A-G Ratio  1.1  1.1 - 2.2         TROPONIN-HIGH SENSITIVITY          Collection Time: 06/26/20 12:17 PM         Result  Value  Ref Range             Troponin-High Sensitivity  7  0 - 51 ng/L       LIPASE          Collection Time: 06/26/20 12:17 PM         Result  Value  Ref Range            Lipase  86  73 - 393 U/L              Radiologic Studies -  XR CHEST PORT       Final Result                 CT Results   (Last 48 hours)          None                 CXR Results   (Last 48 hours)                                    06/26/20 1220    XR CHEST PORT  Final result            Narrative:    Chest single view.             Lungs clear; no interstitial or alveolar pulmonary edema. Cardiac and      mediastinal structures within normal limits.       Neurostimulatory device overlies midline thoracic spine.                             Medical Decision Making and ED Course     I am the first provider for this patient.      I reviewed the vital signs, available nursing notes, past medical history, past surgical history, family history and social history.      Vital Signs-Reviewed the patient's vital signs.   Patient Vitals for the past 12 hrs:            Temp  Pulse  Resp  BP  SpO2            06/26/20 1202  97.7 ??F (36.5 ??C)  73  18  115/80  96 %           EKG interpretation:    KG at 1204 normal sinus rhythm incomplete right bundle branch block.  Rate of 63.  No ST changes.  No T wave inversions.  Normal intervals.  Reason rule out ACS.  Interpreted by ER physician.         Records Reviewed: Previous Hospital chart. EMS run report         ED Course:    Initial assessment performed. The patients presenting problems have been discussed, and they are in agreement with the care plan formulated and outlined with them.  I have encouraged them to ask questions as they arise throughout their visit.        Orders Placed This Encounter        ?  XR CHEST PORT              Standing Status:    Standing         Number of Occurrences:    1         Order Specific Question:    Reason for Exam         Answer:    cp protocol        ?  CBC WITH AUTOMATED DIFF              Standing  Status:    Standing         Number of Occurrences:    1        ?  METABOLIC PANEL, COMPREHENSIVE              Standing Status:    Standing  Number of Occurrences:    1        ?  TROPONIN-HIGH SENSITIVITY              Standing Status:    Standing         Number of Occurrences:    1        ?  LIPASE              Standing Status:    Standing         Number of Occurrences:    1        ?  EKG, 12 LEAD, INITIAL              Standing Status:    Standing         Number of Occurrences:    1         Order Specific Question:    Reason for Exam:         Answer:    Pain        ?  famotidine (PEPCID) tablet 20 mg              Order Specific Question:    H2RA INDICATION              Answer:    Symptomatic GERD                          Provider Notes (Medical Decision Making):    -year-old female with strong family history of heart disease not herself presents with resolved chest pain.  Last episode was yesterday.  Troponin and lipase LFTs exam are all benign.  Lab her follow-up  as an outpatient with a cardiologist based on her family history.  Heart score is low.         HEART SCORE:       History (slight suspicious 0, moderate +1, highly suspicious +2)  0   EKG (normal 0, nonspecific repol changes +1, ST changes +2)  0   Age (<45 y 0, 45-64y +1, >65y +2)  2   Risk Factors HTN, XOL, DM, obesity, smoker, CAD, PAD (none 0, 1-2 factors +1, >3 +2 factors) 1   Troponin (normal 0, 1-3x limit +1, >3x limit +2)  0      Heart Score 3               Management       Scores 0-3: 0.9-1.7% risk of adverse cardiac event. In the HEART Score study, these patients were  discharged (0.99% in the retrospective study, 1.7% in the prospective study)       Scores 4-6: 12-16.6% risk of adverse cardiac event. In the HEART Score study, these patients were  admitted to the hospital. (11.6% retrospective, 16.6% prospective)       Scores ?7: 50-65% risk of adverse cardiac event. In the HEART Score study, these patients  were candidates for early  invasive measures. (65.2% retrospective, 50.1% prospective)       A MACE (Major Adverse Cardiac Event) was defined as all-cause mortality, myocardial infarction, or coronary revascularization.      Original/Primary Reference   ??  Six AJ, Backus BE, Kelder JC. Chest  pain in the emergency room: value of the HEART score. Neth Heart J. 2008;16(6):191-6.    Validation   ??  Backus BE, Six AJ, Kelder JC, et al. A prospective  validation of the HEART score for chest pain  patients at the emergency department. Int J Cardiol. 2013;168(3):2153-8.     ??  Backus BE, Six AJ, Kelder JC, et al. Chest pain in the emergency room: a multicenter validation of the HEART Score. Crit Pathw Cardiol. 2010;9(3):164-9.   ??  Stopyra JP, Riley RF, Hiestand BC, et al. The HEART Pathway Randomized Controlled Trial One Year Outcomes.  [place] 2018;                 Consults                      Discharged        Procedures                                     Disposition           Emergency Department Disposition:  Discharged           Diagnosis        Clinical Impression:       1.  Gastroesophageal reflux disease without esophagitis            Attestations:      Ander Gaster, MD      Please note that this dictation was completed with Dragon, the computer voice recognition software.  Quite often unanticipated grammatical, syntax, homophones, and other interpretive errors are inadvertently  transcribed by the computer software.  Please disregard these errors.  Please excuse any errors that have escaped final proofreading.  Thank you.

## 2020-06-26 NOTE — ED Notes (Signed)
Patient discharged home following routine work found to be within defined limits. Medications, fluids and treatments tolerated well by patient. Normal vital signs. Reviewed follow up instructions and medications with patient. Extensive conversation regarding return precautions discussed. Patient acknowledged understanding. All personal belongings taken by patient.

## 2020-06-27 ENCOUNTER — Encounter

## 2020-06-28 ENCOUNTER — Inpatient Hospital Stay: Payer: MEDICARE | Attending: Emergency Medicine | Primary: Emergency Medicine

## 2020-09-05 ENCOUNTER — Inpatient Hospital Stay
Admit: 2020-09-05 | Discharge: 2020-09-05 | Disposition: A | Discharge status: Home / Self Care | Payer: MEDICARE | Attending: Emergency Medicine

## 2020-09-05 ENCOUNTER — Emergency Department: Admit: 2020-09-05 | Payer: MEDICARE | Primary: Emergency Medicine

## 2020-09-05 DIAGNOSIS — M25421 Effusion, right elbow: Secondary | ICD-10-CM

## 2020-09-05 LAB — CBC WITH AUTO DIFFERENTIAL
Basophils %: 0 % (ref 0–1)
Basophils Absolute: 0 10*3/uL (ref 0.0–0.1)
Eosinophils %: 0 % (ref 0–7)
Eosinophils Absolute: 0 10*3/uL (ref 0.0–0.4)
Granulocyte Absolute Count: 0 10*3/uL (ref 0.00–0.04)
Hematocrit: 40.7 % (ref 35.0–47.0)
Hemoglobin: 13.7 g/dL (ref 11.5–16.0)
Immature Granulocytes: 0 % (ref 0–0.5)
Lymphocytes %: 11 % — ABNORMAL LOW (ref 12–49)
Lymphocytes Absolute: 1.3 10*3/uL (ref 0.8–3.5)
MCH: 31.6 PG (ref 26.0–34.0)
MCHC: 33.7 g/dL (ref 30.0–36.5)
MCV: 94 FL (ref 80.0–99.0)
MPV: 11 FL (ref 8.9–12.9)
Monocytes %: 6 % (ref 5–13)
Monocytes Absolute: 0.7 10*3/uL (ref 0.0–1.0)
NRBC Absolute: 0 10*3/uL (ref 0.00–0.01)
Neutrophils %: 83 % — ABNORMAL HIGH (ref 32–75)
Neutrophils Absolute: 10 10*3/uL — ABNORMAL HIGH (ref 1.8–8.0)
Nucleated RBCs: 0 PER 100 WBC
Platelets: 312 10*3/uL (ref 150–400)
RBC: 4.33 M/uL (ref 3.80–5.20)
RDW: 12.7 % (ref 11.5–14.5)
WBC: 12 10*3/uL — ABNORMAL HIGH (ref 3.6–11.0)

## 2020-09-05 LAB — COMPREHENSIVE METABOLIC PANEL
ALT: 18 U/L (ref 12–78)
AST: 16 U/L (ref 15–37)
Albumin/Globulin Ratio: 1.1 (ref 1.1–2.2)
Albumin: 3.9 g/dL (ref 3.5–5.0)
Alkaline Phosphatase: 81 U/L (ref 45–117)
Anion Gap: 6 mmol/L (ref 5–15)
BUN: 13 mg/dL (ref 6–20)
Bun/Cre Ratio: 13 (ref 12–20)
CO2: 28 mmol/L (ref 21–32)
Calcium: 9.4 mg/dL (ref 8.5–10.1)
Chloride: 101 mmol/L (ref 97–108)
Creatinine: 1.02 mg/dL (ref 0.55–1.02)
EGFR IF NonAfrican American: 54 mL/min/{1.73_m2} — ABNORMAL LOW (ref >60)
GFR African American: >60 mL/min/{1.73_m2} (ref >60)
Globulin: 3.5 g/dL (ref 2.0–4.0)
Glucose: 142 mg/dL — ABNORMAL HIGH (ref 65–100)
Potassium: 3.6 mmol/L (ref 3.5–5.1)
Sodium: 135 mmol/L — ABNORMAL LOW (ref 136–145)
Total Bilirubin: 1.4 mg/dL — ABNORMAL HIGH (ref 0.2–1.0)
Total Protein: 7.4 g/dL (ref 6.4–8.2)

## 2020-09-05 LAB — URIC ACID
Uric Acid, Serum: 4.2 mg/dL (ref 2.6–6.0)
Uric acid: 4.2 mg/dL (ref 2.6–6.0)

## 2020-09-05 LAB — CBC WITH AUTOMATED DIFF
ABS. BASOPHILS: 0 10*3/uL (ref 0.0–0.1)
ABS. EOSINOPHILS: 0 10*3/uL (ref 0.0–0.4)
ABS. IMM. GRANS.: 0 10*3/uL (ref 0.00–0.04)
ABS. LYMPHOCYTES: 1.3 10*3/uL (ref 0.8–3.5)
ABS. MONOCYTES: 0.7 10*3/uL (ref 0.0–1.0)
ABS. NEUTROPHILS: 10 10*3/uL — ABNORMAL HIGH (ref 1.8–8.0)
ABSOLUTE NRBC: 0 10*3/uL (ref 0.00–0.01)
BASOPHILS: 0 % (ref 0–1)
EOSINOPHILS: 0 % (ref 0–7)
HCT: 40.7 % (ref 35.0–47.0)
HGB: 13.7 g/dL (ref 11.5–16.0)
IMMATURE GRANULOCYTES: 0 % (ref 0–0.5)
LYMPHOCYTES: 11 % — ABNORMAL LOW (ref 12–49)
MCH: 31.6 PG (ref 26.0–34.0)
MCHC: 33.7 g/dL (ref 30.0–36.5)
MCV: 94 FL (ref 80.0–99.0)
MONOCYTES: 6 % (ref 5–13)
MPV: 11 fL (ref 8.9–12.9)
NEUTROPHILS: 83 % — ABNORMAL HIGH (ref 32–75)
NRBC: 0 PER 100 WBC
PLATELET: 312 10*3/uL (ref 150–400)
RBC: 4.33 M/uL (ref 3.80–5.20)
RDW: 12.7 % (ref 11.5–14.5)
WBC: 12 10*3/uL — ABNORMAL HIGH (ref 3.6–11.0)

## 2020-09-05 LAB — METABOLIC PANEL, COMPREHENSIVE
A-G Ratio: 1.1 (ref 1.1–2.2)
ALT (SGPT): 18 U/L (ref 12–78)
AST (SGOT): 16 U/L (ref 15–37)
Albumin: 3.9 g/dL (ref 3.5–5.0)
Alk. phosphatase: 81 U/L (ref 45–117)
Anion gap: 6 mmol/L (ref 5–15)
BUN/Creatinine ratio: 13 (ref 12–20)
BUN: 13 mg/dL (ref 6–20)
Bilirubin, total: 1.4 mg/dL — ABNORMAL HIGH (ref 0.2–1.0)
CO2: 28 mmol/L (ref 21–32)
Calcium: 9.4 mg/dL (ref 8.5–10.1)
Chloride: 101 mmol/L (ref 97–108)
Creatinine: 1.02 mg/dL (ref 0.55–1.02)
GFR est AA: >60 mL/min/{1.73_m2} (ref >60)
GFR est non-AA: 54 mL/min/{1.73_m2} — ABNORMAL LOW (ref >60)
Globulin: 3.5 g/dL (ref 2.0–4.0)
Glucose: 142 mg/dL — ABNORMAL HIGH (ref 65–100)
Potassium: 3.6 mmol/L (ref 3.5–5.1)
Protein, total: 7.4 g/dL (ref 6.4–8.2)
Sodium: 135 mmol/L — ABNORMAL LOW (ref 136–145)

## 2020-09-05 MED ORDER — HYDROMORPHONE 1 MG/ML INJECTION SOLUTION
1 mg/mL | Freq: Once | INTRAMUSCULAR | Status: AC
Start: 2020-09-05 — End: 2020-09-05
  Administered 2020-09-05: 12:00:00 via INTRAVENOUS

## 2020-09-05 MED ORDER — METHYLPREDNISOLONE 4 MG TABS IN A DOSE PACK
4 mg | ORAL | 0 refills | Status: AC
Start: 2020-09-05 — End: ?

## 2020-09-05 MED ORDER — ONDANSETRON (PF) 4 MG/2 ML INJECTION
4 mg/2 mL | Freq: Once | INTRAMUSCULAR | Status: AC
Start: 2020-09-05 — End: 2020-09-05
  Administered 2020-09-05: 12:00:00 via INTRAVENOUS

## 2020-09-05 MED ORDER — OXYCODONE-ACETAMINOPHEN 10 MG-325 MG TAB
10-325 mg | ORAL_TABLET | Freq: Four times a day (QID) | ORAL | 0 refills | Status: AC | PRN
Start: 2020-09-05 — End: 2020-10-05

## 2020-09-05 MED ORDER — NAPROXEN 500 MG TAB
500 mg | ORAL_TABLET | Freq: Two times a day (BID) | ORAL | 0 refills | Status: AC
Start: 2020-09-05 — End: ?

## 2020-09-05 MED ORDER — DICLOFENAC 1 % TOPICAL GEL
1 % | Freq: Four times a day (QID) | CUTANEOUS | 0 refills | Status: AC
Start: 2020-09-05 — End: ?

## 2020-09-05 MED FILL — HYDROMORPHONE (PF) 1 MG/ML IJ SOLN: 1 mg/mL | INTRAMUSCULAR | Qty: 1

## 2020-09-05 MED FILL — ONDANSETRON (PF) 4 MG/2 ML INJECTION: 4 mg/2 mL | INTRAMUSCULAR | Qty: 2

## 2020-09-05 NOTE — ED Notes (Signed)
Pt arrives with wrist pain 10/10 that began Friday. Wrist is warm to the touch and red. Denies any injury/trauma.

## 2020-09-05 NOTE — ED Provider Notes (Signed)
ED Provider Notes by Ander Gaster, MD at 09/05/20 [phone]                Author: Ander Gaster, MD  Service: --  Author Type: Physician       Filed: 09/05/20 1148  Date of Service: 09/05/20 0929  Status: Signed          Editor: Ander Gaster, MD (Physician)                                EMERGENCY DEPARTMENT HISTORY AND PHYSICAL EXAM         Date: 09/05/2020   Patient Name: Sandra Mercer        History of Presenting Illness          Chief Complaint       Patient presents with        ?  Wrist Pain             right wrist           History Provided By: Patient      HPI: Sandra Mercer,  70 y.o. female with a past medical history significant  No significant past medical history presents to the ED with chief complaint of Wrist Pain (right wrist)   .    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  She has been painting for the last 1 month.  Using her right arm.  Initially thought she had tennis elbow then golfers elbow.  She has had significant increasing pain at the right elbow that needs  to be swelling now.  Wrist seems to be swelling with occasional pain radiating down.  Shoulder is achy.  Not swollen.  Has been trying over-the-counter medicine with minimal relief.  She has not fallen or hit it.               There are no other complaints, changes, or physical findings at this time.      PCP: None        Current Outpatient Medications          Medication  Sig  Dispense  Refill           ?  diclofenac (Voltaren Arthritis Pain) 1 % gel  Apply 2 g to affected area four (4) times daily.  100 g  0           ?  oxyCODONE-acetaminophen (Percocet) 10-325 mg per tablet  Take 1 Tablet by mouth every six (6) hours as needed for Pain for up to 30 days. Max Daily Amount: 4 Tablets.  120 Tablet  0           ?  naproxen (NAPROSYN) 500 mg tablet  Take 1 Tablet by mouth two (2) times daily (with meals).  20 Tablet  0     ?  methylPREDNISolone (Medrol, Pak,) 4 mg tablet  As directed  1 Dose Pack  0            ?  HYDROcodone-acetaminophen (NORCO) 7.5-325 mg per tablet  take ONE tablet every SIX hours as needed FOR pain                 Past History        Past Medical History:     Past Medical History:        Diagnosis  Date         ?  RSD (reflex sympathetic dystrophy)             Past Surgical History: denies      Family History: denies      Social History:     Social History          Tobacco Use         ?  Smoking status:  Never Smoker     ?  Smokeless tobacco:  Never Used       Substance Use Topics         ?  Alcohol use:  Yes         ?  Drug use:  Not on file           Allergies:   No Known Allergies           Review of Systems     Review of Systems    Constitutional: Negative.  Negative for chills, fatigue and fever.    HENT: Negative.  Negative for congestion, nosebleeds and sore throat.     Eyes: Negative.  Negative for pain, discharge and visual disturbance.    Respiratory: Negative.  Negative for cough, chest tightness and shortness of breath.     Cardiovascular: Negative for chest pain, palpitations and leg swelling.    Gastrointestinal: Negative for abdominal pain, blood in stool, constipation, diarrhea, nausea and vomiting.    Endocrine: Negative.     Genitourinary: Negative.  Negative for difficulty urinating, dysuria, pelvic pain and vaginal bleeding.    Musculoskeletal: Positive for joint swelling. Negative for arthralgias, back pain and myalgias.    Skin: Negative.  Negative for rash and wound.    Allergic/Immunologic: Negative.     Neurological: Negative.  Negative for dizziness, syncope, weakness, numbness and headaches.    Hematological: Negative.     Psychiatric/Behavioral: Negative.  Negative for agitation, confusion and suicidal ideas.    All other systems reviewed and are negative.           Physical Exam     Physical Exam   Vitals and nursing note reviewed. Exam conducted with a chaperone present.   Constitutional:        Appearance: Normal appearance. She is normal weight.   HENT :        Head: Normocephalic and atraumatic.      Nose: Nose normal.      Mouth/Throat:      Mouth: Mucous membranes are moist.      Pharynx: Oropharynx is clear.   Eyes:       Extraocular Movements: Extraocular movements intact.      Conjunctiva/sclera: Conjunctivae normal.      Pupils: Pupils are equal, round, and reactive to light.   Cardiovascular :       Rate and Rhythm: Normal rate and regular rhythm.      Pulses: Normal pulses.      Heart sounds: Normal heart sounds.    Pulmonary:       Effort: Pulmonary effort is normal. No respiratory distress.      Breath sounds: Normal breath sounds.    Abdominal:      General: Abdomen is flat. Bowel sounds are normal. There is no distension.      Palpations: Abdomen is soft.      Tenderness: There is no abdominal tenderness. There  is no guarding.     Musculoskeletal:          General: Swelling present. No tenderness, deformity or signs of injury. Normal  range of motion.      Cervical back: Normal range of  motion and neck supple.      Right lower leg: No edema.      Left lower leg: No edema.      Comments: R elbow and wrist. 2+ dp pulse     Skin:      General: Skin is warm and dry.      Capillary Refill: Capillary refill takes less than 2 seconds.      Findings: No lesion or rash.   Neurological:       General: No focal deficit present.      Mental Status: She is alert and oriented to person, place, and time. Mental status is at baseline.      Cranial Nerves: No cranial nerve deficit.   Psychiatric:         Mood and Affect: Mood normal.         Behavior: Behavior  normal.         Thought Content: Thought content normal.         Judgment: Judgment normal.               Diagnostic Study Results        Labs -         Recent Results (from the past 12 hour(s))     URIC ACID          Collection Time: 09/05/20  7:37 AM         Result  Value  Ref Range            Uric acid  4.2  2.6 - 6.0 mg/dL       CBC WITH AUTOMATED DIFF          Collection Time: 09/05/20  7:37 AM         Result   Value  Ref Range            WBC  12.0 (H)  3.6 - 11.0 K/uL       RBC  4.33  3.80 - 5.20 M/uL       HGB  13.7  11.5 - 16.0 g/dL       HCT  40.7  35.0 - 47.0 %       MCV  94.0  80.0 - 99.0 FL       MCH  31.6  26.0 - 34.0 PG       MCHC  33.7  30.0 - 36.5 g/dL       RDW  12.7  11.5 - 14.5 %       PLATELET  312  150 - 400 K/uL       MPV  11.0  8.9 - 12.9 FL       NRBC  0.0  0.0 PER 100 WBC       ABSOLUTE NRBC  0.00  0.00 - 0.01 K/uL       NEUTROPHILS  83 (H)  32 - 75 %       LYMPHOCYTES  11 (L)  12 - 49 %       MONOCYTES  6  5 - 13 %       EOSINOPHILS  0  0 - 7 %       BASOPHILS  0  0 - 1 %       IMMATURE GRANULOCYTES  0  0 - 0.5 %       ABS. NEUTROPHILS  10.0 (H)  1.8 - 8.0 K/UL       ABS. LYMPHOCYTES  1.3  0.8 - 3.5 K/UL       ABS. MONOCYTES  0.7  0.0 - 1.0 K/UL       ABS. EOSINOPHILS  0.0  0.0 - 0.4 K/UL       ABS. BASOPHILS  0.0  0.0 - 0.1 K/UL       ABS. IMM. GRANS.  0.0  0.00 - 0.04 K/UL       DF  AUTOMATED          METABOLIC PANEL, COMPREHENSIVE          Collection Time: 09/05/20  7:37 AM         Result  Value  Ref Range            Sodium  135 (L)  136 - 145 mmol/L       Potassium  3.6  3.5 - 5.1 mmol/L       Chloride  101  97 - 108 mmol/L       CO2  28  21 - 32 mmol/L       Anion gap  6  5 - 15 mmol/L       Glucose  142 (H)  65 - 100 mg/dL       BUN  13  6 - 20 mg/dL       Creatinine  1.02  0.55 - 1.02 mg/dL       BUN/Creatinine ratio  13  12 - 20         GFR est AA  >60  >60 ml/min/1.27m       GFR est non-AA  54 (L)  >60 ml/min/1.738m      Calcium  9.4  8.5 - 10.1 mg/dL       Bilirubin, total  1.4 (H)  0.2 - 1.0 mg/dL       AST (SGOT)  16  15 - 37 U/L       ALT (SGPT)  18  12 - 78 U/L       Alk. phosphatase  81  45 - 117 U/L       Protein, total  7.4  6.4 - 8.2 g/dL       Albumin  3.9  3.5 - 5.0 g/dL       Globulin  3.5  2.0 - 4.0 g/dL            A-G Ratio  1.1  1.1 - 2.2                Radiologic Studies -      XR WRIST RT AP/LAT/OBL MIN 3V       Final Result     Diffuse soft tissue swelling. Mild  degenerative changes without     acute osseous abnormality.            XR ELBOW RT MIN 3 V       Final Result     Large joint effusion without acute osseous abnormality.            XR SHOULDER RT AP/LAT MIN 2 V       Final Result     Degenerative changes without acute abnormality.                 CT Results   (Last 48 hours)          None  CXR Results   (Last 48 hours)          None                    Medical Decision Making and ED Course     I am the first provider for this patient.      I reviewed the vital signs, available nursing notes, past medical history, past surgical history, family history and social history.      Vital Signs-Reviewed the patient's vital signs.   Patient Vitals for the past 12 hrs:            Temp  Pulse  Resp  BP  SpO2            09/05/20 0727  98.2 ??F (36.8 ??C)  98  19  (!) 184/84  99 %           EKG interpretation:             Records Reviewed: Previous Hospital chart. EMS run report         ED Course:    Initial assessment performed. The patients presenting problems have been discussed, and they are in agreement with the care plan formulated and outlined with them.  I have encouraged them to ask questions as they arise throughout their visit.        Orders Placed This Encounter        ?  XR WRIST RT AP/LAT/OBL MIN 3V              Standing Status:    Standing         Number of Occurrences:    1         Order Specific Question:    Transport         Answer:    Wheelchair [7]         Order Specific Question:    Reason for Exam         Answer:    wrist pain        ?  XR ELBOW RT MIN 3 V              Standing Status:    Standing         Number of Occurrences:    1         Order Specific Question:    Transport         Answer:    BED [2]         Order Specific Question:    Reason for Exam         Answer:    pain        ?  XR SHOULDER RT AP/LAT MIN 2 V              Standing Status:    Standing         Number of Occurrences:    1         Order Specific Question:    Transport          Answer:    BED [2]         Order Specific Question:    Reason for Exam         Answer:    pain        ?  URIC ACID              Standing Status:    Standing  Number of Occurrences:    1        ?  CBC WITH AUTOMATED DIFF              Standing Status:    Standing         Number of Occurrences:    1        ?  METABOLIC PANEL, COMPREHENSIVE              Standing Status:    Standing         Number of Occurrences:    1        ?  APPLY SLING;  SPECIFY ONE TIME Routine              Standing Status:    Standing         Number of Occurrences:    1        ?  DURABLE MEDICAL EQUIPMENT               Standing Status:    Standing         Number of Occurrences:    1         Order Specific Question:    Equipment:         Answer:    Arm Sling, Right         Order Specific Question:    Size         Answer:    M        ?  ondansetron (ZOFRAN) injection 4 mg     ?  HYDROmorphone (DILAUDID) injection 1 mg     ?  diclofenac (Voltaren Arthritis Pain) 1 % gel             Sig: Apply 2 g to affected area four (4) times daily.         Dispense:  100 g         Refill:  0        ?  oxyCODONE-acetaminophen (Percocet) 10-325 mg per tablet             Sig: Take 1 Tablet by mouth every six (6) hours as needed for Pain for up to 30 days. Max Daily Amount: 4 Tablets.         Dispense:  120 Tablet         Refill:  0        ?  naproxen (NAPROSYN) 500 mg tablet             Sig: Take 1 Tablet by mouth two (2) times daily (with meals).         Dispense:  20 Tablet         Refill:  0        ?  methylPREDNISolone (Medrol, Pak,) 4 mg tablet             Sig: As directed         Dispense:  1 Dose Pack             Refill:  0                          Provider Notes (Medical Decision Making): 70 year old with right elbow swelling effusion secondary to a significant overuse tendinitis.  Sling applied.  Orthopedics for follow-up.  Encouraged to use  ice steroid pack and pain medicine.  Consults                     Discharged        Procedures                          Disposition           Emergency Department Disposition:  Discharged           Diagnosis        Clinical Impression:       1.  Joint effusion of elbow, right         2.  Tendonitis            Attestations:      Ander Gaster, MD      Please note that this dictation was completed with Dragon, the computer voice recognition software.  Quite often unanticipated grammatical, syntax, homophones, and other interpretive errors are inadvertently  transcribed by the computer software.  Please disregard these errors.  Please excuse any errors that have escaped final proofreading.  Thank you.

## 2020-09-13 ENCOUNTER — Encounter: Attending: Orthopaedic Surgery

## 2020-09-14 ENCOUNTER — Encounter

## 2020-09-18 ENCOUNTER — Inpatient Hospital Stay: Admit: 2020-09-18 | Payer: MEDICARE | Primary: Emergency Medicine

## 2020-09-18 ENCOUNTER — Encounter

## 2020-09-18 DIAGNOSIS — C642 Malignant neoplasm of left kidney, except renal pelvis: Secondary | ICD-10-CM

## 2020-09-18 LAB — CREATININE AND GFR
Creatinine: 1.1 mg/dL — ABNORMAL HIGH (ref 0.55–1.02)
Creatinine: 1.1 mg/dL — ABNORMAL HIGH (ref 0.55–1.02)
EGFR IF NonAfrican American: 49 mL/min/{1.73_m2} — ABNORMAL LOW (ref >60)
GFR African American: 60 mL/min/{1.73_m2} — ABNORMAL LOW (ref >60)
GFR est AA: 60 mL/min/{1.73_m2} — ABNORMAL LOW (ref >60)
GFR est non-AA: 49 mL/min/{1.73_m2} — ABNORMAL LOW (ref >60)

## 2020-09-18 MED ORDER — IOPAMIDOL 76 % IV SOLN
370 mg iodine /mL (76 %) | Freq: Once | INTRAVENOUS | Status: AC
Start: 2020-09-18 — End: 2020-09-18
  Administered 2020-09-18: 15:00:00 via INTRAVENOUS

## 2020-09-18 MED FILL — ISOVUE-370  76 % INTRAVENOUS SOLUTION: 370 mg iodine /mL (76 %) | INTRAVENOUS | Qty: 100

## 2020-09-29 ENCOUNTER — Encounter

## 2020-10-05 ENCOUNTER — Inpatient Hospital Stay: Admit: 2020-10-05 | Payer: MEDICARE | Attending: Emergency Medicine | Primary: Emergency Medicine

## 2020-10-05 DIAGNOSIS — Z1231 Encounter for screening mammogram for malignant neoplasm of breast: Secondary | ICD-10-CM

## 2020-10-11 ENCOUNTER — Encounter

## 2020-10-24 ENCOUNTER — Inpatient Hospital Stay: Admit: 2020-10-24 | Payer: MEDICARE | Attending: Emergency Medicine | Primary: Emergency Medicine

## 2020-10-24 ENCOUNTER — Inpatient Hospital Stay: Payer: MEDICARE | Attending: Emergency Medicine | Primary: Emergency Medicine

## 2020-10-24 DIAGNOSIS — R928 Other abnormal and inconclusive findings on diagnostic imaging of breast: Secondary | ICD-10-CM

## 2020-11-27 ENCOUNTER — Encounter

## 2020-11-29 ENCOUNTER — Ambulatory Visit: Attending: Orthopaedic Surgery | Primary: Emergency Medicine

## 2020-11-29 ENCOUNTER — Encounter: Payer: MEDICARE | Attending: Orthopaedic Surgery | Primary: Emergency Medicine

## 2020-11-29 ENCOUNTER — Ambulatory Visit
Admit: 2020-11-29 | Discharge: 2020-11-29 | Payer: MEDICARE | Attending: Orthopaedic Surgery | Primary: Emergency Medicine

## 2020-11-29 ENCOUNTER — Encounter

## 2020-11-29 DIAGNOSIS — M25532 Pain in left wrist: Secondary | ICD-10-CM

## 2020-11-29 MED ORDER — TRIAMCINOLONE ACETONIDE 40 MG/ML SUSP FOR INJECTION
40 mg/mL | Freq: Once | INTRAMUSCULAR | Status: AC
Start: 2020-11-29 — End: 2020-11-29
  Administered 2020-11-29: 20:00:00

## 2020-11-29 MED ORDER — LIDOCAINE HCL 1 % (10 MG/ML) IJ SOLN
10 mg/mL (1 %) | Freq: Once | INTRAMUSCULAR | Status: AC
Start: 2020-11-29 — End: 2020-11-29
  Administered 2020-11-29: 20:00:00

## 2020-11-29 NOTE — Progress Notes (Signed)
Name: Sandra Mercer    DOB: Mar 24, 1950     Service Dept: [facility]    Chief Complaint   Patient presents with    Elbow Pain        Visit Vitals  Ht 5\' 4"  (1.626 m)   Wt 114 lb (51.7 kg)   BMI 19.57 kg/m??        No Known Allergies     Current Outpatient Medications   Medication Sig Dispense Refill    oxyCODONE IR (ROXICODONE) 10 mg tab immediate release tablet TAKE 1/2 - 1 TABLET BY MOUTH EVERY 6 HOURS AS NEEDED FOR SEVERE PAIN 10 DAYS      oxyCODONE-acetaminophen (PERCOCET 10) 10-325 mg per tablet Take 1 Tablet by mouth every six (6) hours as needed.      traZODone (DESYREL) 150 mg tablet       diclofenac (Voltaren Arthritis Pain) 1 % gel Apply 2 g to affected area four (4) times daily. 100 g 0    naproxen (NAPROSYN) 500 mg tablet Take 1 Tablet by mouth two (2) times daily (with meals). 20 Tablet 0    methylPREDNISolone (Medrol, Pak,) 4 mg tablet As directed 1 Dose Pack 0    HYDROcodone-acetaminophen (NORCO) 7.5-325 mg per tablet take ONE tablet every SIX hours as needed FOR pain       Current Facility-Administered Medications   Medication Dose Route Frequency Provider Last Rate Last Admin    triamcinolone acetonide (KENALOG-40) 40 mg/mL injection 40 mg  40 mg Other ONCE Lyndia Bury A, MD        lidocaine (XYLOCAINE) 10 mg/mL (1 %) injection 1 mL  1 mL Other ONCE Travius Crochet A, MD        triamcinolone acetonide (KENALOG-40) 40 mg/mL injection 40 mg  40 mg Other ONCE Ginny Loomer A, MD        lidocaine (XYLOCAINE) 10 mg/mL (1 %) injection 1 mL  1 mL Other ONCE Kyllian Clingerman A, MD          There is no problem list on file for this patient.     Family History   Problem Relation Age of Onset    Cancer Mother     Heart Disease Father     Breast Cancer Sister       Social History     Socioeconomic History    Marital status: DIVORCED   Tobacco Use    Smoking status: Former     Types: Cigarettes    Smokeless tobacco: Never   Substance and Sexual Activity    Alcohol use: Yes       Past Surgical History:   Procedure Laterality Date    HX CYST INCISION AND DRAINAGE      HX KNEE REPLACEMENT      IMPLANT BREAST SILICONE/EQ        Past Medical History:   Diagnosis Date    Arthritis     RSD (reflex sympathetic dystrophy)         I have reviewed and agree with [place] and ROS and intake form in chart and the record furthermore I have reviewed prior medical record(s) regarding this patients care during this appointment.     Review of Systems:   Patient is a pleasant appearing individual, appropriately dressed, well hydrated, well nourished, who is alert, appropriately oriented for age, and in no acute distress with a normal gait and normal affect who does not appear to be in  any significant pain.     Physical Exam:  Left hand- Grossly neurovascularly intact with good cap refill, decreased range of motion, decreased strength, positive crepitation, minimal swelling, skin intact with no skin lesions, no erythema, no echymosis, no point tenderness.     Right hand - No point tenderness, Full range of motion, No instability, No Weakness, No, skin lesions, No swelling, No instability, Grossly neurovascularly intact.    Right Elbow- Full Range of motion, Grossly neurovascularly intact, No instabililty, Positive weakness, Mild swelling, Point tenderness on the medial epicondyle, No crepitation, No skin rashes or lesions identified.     Left Elbow- Full Range of motion, No point tenderness, No instability, Normal Strength, No skin lesions, No swelling, Grossly neurovascularly intact.    Procedure Documentation:    I discussed in detail the risks, benefits and complications of an injection which included but are not limited to infection, skin reactions, hot swollen joint, and anaphylaxis with the patient. The patient verbalized understanding and gave informed consent for the injection. The patient's right elbow was prepped using sterile alcohol solution. A sterile needle was inserted into the right elbow and  the mixture of 1 mL Lidocaine 1%, 1 mL Kenalog 40 mg was injected under sterile technique. The needle was withdrawn and the puncture site sealed with a Band-Aid.      Technique: Under sterile conditions a GE ultrasound unit with a variable frequency (7.0-14.0 MHz) linear transducer was used to localize the placement of needle into the right elbow joint.    Findings: Successful needle placement for elbow injection.  Final images were taken and saved for permanent record.      The patient tolerated the injection well. The patient was instructed to call the office immediately if there is any pain, redness, warmth, fever, or chills.    Procedure Documentation:    I discussed in detail the risks, benefits and complications of an injection which included but are not limited to infection, skin reactions, hot swollen joint, and anaphylaxis with the patient. The patient verbalized understanding and gave informed consent for the injection. The patient's left wrist was prepped using sterile alcohol solution. A sterile needle was inserted into the left wrist and the mixture of 1 mL Lidocaine 1%, 1 mL Kenalog 40 mg was injected under sterile technique. The needle was withdrawn and the puncture site sealed with a Band-Aid.      Technique: Under sterile conditions a GE ultrasound unit with a variable frequency (7.0-14.0 MHz) linear transducer was used to localize the placement of needle into the left wrist joint.    Findings: Successful needle placement for wrist injection.  Final images were taken and saved for permanent record.      The patient tolerated the injection well. The patient was instructed to call the office immediately if there is any pain, redness, warmth, fever, or chills.     Encounter Diagnoses     ICD-10-CM ICD-9-CM   1. Left wrist pain  M25.532 719.43   2. Right elbow pain  M25.521 719.42   3. Medial epicondylitis of right elbow  M77.01 726.31   4. Primary osteoarthritis of left wrist  M19.032 715.13        HPI:  The patient is here with a chief complaint of left wrist pain, right elbow pain, throbbing, burning pain, progressively getting worse.  Pain is 8/10.  Continues to have difficulty with it.    X-rays are positive for mild OA in the left wrist.  X-rays  for right elbow are unremarkable.    Assessment/Plan:  Plan at this point, cortisone injection, right elbow and left wrist.  I will see the patient post injection and if she is not better and go from there.    As part of continued conservative pain management options the patient was advised to utilize Tylenol or OTC NSAIDS as long as it is not medically contraindicated.     Return to Office:   Follow-up and Dispositions    Return if symptoms worsen or fail to improve.           Scribed by Briscoe Burns as dictated by [facility] A. Posey Pronto, MD.  Documentation True and Accepted Tayla Panozzo A. Posey Pronto, MD

## 2021-01-10 ENCOUNTER — Ambulatory Visit: Payer: MEDICARE | Attending: Obstetrics | Primary: Emergency Medicine

## 2021-06-10 ENCOUNTER — Encounter
